# Patient Record
Sex: Female | Born: 1995 | Race: White | Hispanic: No | Marital: Married
Health system: Midwestern US, Community
[De-identification: ages and names within clinical notes are randomized; demographics above are authoritative.]

## PROBLEM LIST (undated history)

## (undated) DIAGNOSIS — N926 Irregular menstruation, unspecified: Secondary | ICD-10-CM

## (undated) DIAGNOSIS — F509 Eating disorder, unspecified: Secondary | ICD-10-CM

## (undated) DIAGNOSIS — F909 Attention-deficit hyperactivity disorder, unspecified type: Secondary | ICD-10-CM

## (undated) DIAGNOSIS — F329 Major depressive disorder, single episode, unspecified: Secondary | ICD-10-CM

## (undated) DIAGNOSIS — R7303 Prediabetes: Secondary | ICD-10-CM

## (undated) DIAGNOSIS — F32A Depression, unspecified: Secondary | ICD-10-CM

## (undated) HISTORY — PX: OTHER SURGICAL HISTORY: SHX169

## (undated) HISTORY — DX: Prediabetes: R73.03

## (undated) HISTORY — DX: Attention-deficit hyperactivity disorder, unspecified type: F90.9

## (undated) HISTORY — DX: Eating disorder, unspecified: F50.9

## (undated) HISTORY — DX: Depression, unspecified: F32.A

---
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
## 2016-09-19 ENCOUNTER — Ambulatory Visit (INDEPENDENT_AMBULATORY_CARE_PROVIDER_SITE_OTHER): Payer: TRICARE For Life (TFL) | Admitting: Advanced Practice Midwife

## 2016-09-19 ENCOUNTER — Encounter: Payer: Self-pay | Admitting: Advanced Practice Midwife

## 2016-09-19 VITALS — BP 138/72 | HR 92 | Ht 68.0 in | Wt 288.0 lb

## 2016-09-19 DIAGNOSIS — Z30011 Encounter for initial prescription of contraceptive pills: Secondary | ICD-10-CM

## 2016-09-19 DIAGNOSIS — Z113 Encounter for screening for infections with a predominantly sexual mode of transmission: Secondary | ICD-10-CM

## 2016-09-19 DIAGNOSIS — Z01419 Encounter for gynecological examination (general) (routine) without abnormal findings: Secondary | ICD-10-CM

## 2016-09-19 MED ORDER — NORGESTIMATE-ETH ESTRADIOL 0.25-35 MG-MCG PO TABS: 1.0000 | ORAL_TABLET | Freq: Every day | ORAL | 11 refills | Status: DC

## 2016-09-21 LAB — GC/CHLAMYDIA PROBE AMP
Chlamydia trachomatis, NAA: NEGATIVE
Neisseria gonorrhoeae by PCR: NEGATIVE

## 2016-09-21 NOTE — Progress Notes (Signed)
Patient ID: Faith Morris, female   DOB: Jan 14, 1996, 21 y.o.   MRN: [id number]     Gynecology Annual Exam  PCP: Patient, No Pcp Per  Chief Complaint:  Chief Complaint  Patient presents with  . Gynecologic Exam    History of Present Illness: Patient is a 21 y.o. G0P0000 presents for annual exam. The patient has complaints today of heavy, painful periods with clots. She is interested in hormonal regulation of her cycles. She takes metformin due to her pre-diabetic condition per her PCP.  LMP: Patient's last menstrual period was 08/29/2016 (approximate). Menarche:not applicable Average Interval: regular, 28 days Duration of flow: 4 days Heavy Menses: yes Clots: yes Intermenstrual Bleeding: no Postcoital Bleeding: no Dysmenorrhea: yes  The patient is sexually active. She currently uses condoms for contraception. She denies dyspareunia.  The patient does perform self breast exams.  There is no notable family history of breast or ovarian cancer in her family.  The patient wears seatbelts: yes.  The patient has regular exercise: yes.  She admits to improving healthy diet.  The patient denies current symptoms of depression.    Review of Systems: Review of Systems  Constitutional: Negative.   HENT: Negative.   Eyes: Negative.   Respiratory: Negative.   Cardiovascular: Negative.   Gastrointestinal: Negative.   Genitourinary: Negative.   Musculoskeletal: Negative.   Skin: Negative.   Neurological: Negative.   Endo/Heme/Allergies: Negative.   Psychiatric/Behavioral: Negative.     Past Medical History:  Past Medical History:  Diagnosis Date  . ADHD   . Prediabetes     Past Surgical History:  Past Surgical History:  Procedure Laterality Date  . wisdom teeth      Gynecologic History:  Patient's last menstrual period was 08/29/2016 (approximate). Contraception: condoms Last Pap: Has never had a PAP  Obstetric History: G0P0000  Family History:  Family History    Problem Relation Age of Onset  . Breast cancer Maternal Aunt 63    Social History:  Social History   Social History  . Marital status: Single    Spouse name: N/A  . Number of children: N/A  . Years of education: N/A   Occupational History  . Not on file.   Social History Main Topics  . Smoking status: Never Smoker  . Smokeless tobacco: Never Used  . Alcohol use No  . Drug use: No  . Sexual activity: Yes    Birth control/ protection: Condom, None   Other Topics Concern  . Not on file   Social History Narrative  . No narrative on file    Allergies:  Allergies  Allergen Reactions  . Cephalexin Itching, Swelling and Rash    Medications: Prior to Admission medications   Medication Sig Start Date End Date Taking? Authorizing Provider  metFORMIN (GLUCOPHAGE) 500 MG tablet Take by mouth 2 (two) times daily with a meal.   Yes [provider]  norgestimate-ethinyl estradiol (ORTHO-CYCLEN,SPRINTEC,PREVIFEM) 0.25-35 MG-MCG tablet Take 1 tablet by mouth daily. 09/19/16   Tresea Mall, CNM    Physical Exam Vitals: Blood pressure 138/72, pulse 92, height 5\' 8"  (1.727 m), weight 288 lb (130.6 kg), last menstrual period 08/29/2016.  General: NAD HEENT: normocephalic, anicteric Thyroid: no enlargement, no palpable nodules Pulmonary: No increased work of breathing, CTAB Cardiovascular: RRR, distal pulses 2+ Breast: Breast symmetrical, no tenderness, no palpable nodules or masses, no skin or nipple retraction present, no nipple discharge.  No axillary or supraclavicular lymphadenopathy. Abdomen: NABS, soft, non-tender, non-distended.  Umbilicus without  lesions.  No hepatomegaly, splenomegaly or masses palpable. No evidence of hernia  Genitourinary:  External: Normal external female genitalia.  Normal urethral meatus, normal  Bartholin's and Skene's glands.    Vagina: Normal vaginal mucosa, no evidence of prolapse.    Cervix: Grossly normal in appearance, no bleeding,  no CMT  Uterus: Non-enlarged, mobile, normal contour.    Adnexa: ovaries non-enlarged, no adnexal masses  Rectal: deferred  Lymphatic: no evidence of inguinal lymphadenopathy Extremities: no edema, erythema, or tenderness Neurologic: Grossly intact Psychiatric: mood appropriate, affect full   Assessment: 21 y.o. G0P0000 routine annual exam  Plan: Problem List Items Addressed This Visit    None    Visit Diagnoses    Well woman exam with routine gynecological exam    -  Primary   Relevant Orders   GC/Chlamydia Probe Amp (Completed)   Screen for sexually transmitted diseases       Relevant Orders   GC/Chlamydia Probe Amp (Completed)   Encounter for initial prescription of contraceptive pills       Relevant Medications   norgestimate-ethinyl estradiol (ORTHO-CYCLEN,SPRINTEC,PREVIFEM) 0.25-35 MG-MCG tablet      1) 4) Gardasil Series discussed and if applicable offered to patient - Patient is uncertain if she has previously completed 3 shot series   2) STI screening was offered and accepted   3) Increase healthy lifestyle diet   4) ASCCP guidelines and rational discussed.  Patient opts for beginning at age 13 screening interval  5) Contraception - Education given regarding options for contraception, including LARCs, pills, ring, patch, injection. The patient would like to try OCP for both contraception and cycle regulation  6) Follow up 1 year for routine annual exam  Tresea Mall, CNM

## 2016-10-24 DIAGNOSIS — G4762 Sleep related leg cramps: Secondary | ICD-10-CM | POA: Insufficient documentation

## 2016-10-24 DIAGNOSIS — R7303 Prediabetes: Secondary | ICD-10-CM | POA: Insufficient documentation

## 2017-05-22 ENCOUNTER — Ambulatory Visit: Payer: Self-pay | Admitting: Primary Care

## 2017-06-06 ENCOUNTER — Ambulatory Visit: Payer: Self-pay | Admitting: Primary Care

## 2017-06-06 ENCOUNTER — Encounter

## 2018-03-13 ENCOUNTER — Other Ambulatory Visit (HOSPITAL_COMMUNITY)
Admission: RE | Admit: 2018-03-13 | Discharge: 2018-03-13 | Disposition: A | Discharge status: Home / Self Care | Payer: Self-pay | Source: Ambulatory Visit | Attending: Obstetrics & Gynecology | Admitting: Obstetrics & Gynecology

## 2018-03-13 ENCOUNTER — Encounter: Payer: Self-pay | Admitting: Obstetrics & Gynecology

## 2018-03-13 ENCOUNTER — Ambulatory Visit (INDEPENDENT_AMBULATORY_CARE_PROVIDER_SITE_OTHER): Admitting: Obstetrics & Gynecology

## 2018-03-13 VITALS — BP 130/80 | HR 105 | Ht 68.0 in | Wt 284.0 lb

## 2018-03-13 DIAGNOSIS — Z124 Encounter for screening for malignant neoplasm of cervix: Secondary | ICD-10-CM

## 2018-03-13 DIAGNOSIS — Z113 Encounter for screening for infections with a predominantly sexual mode of transmission: Secondary | ICD-10-CM | POA: Insufficient documentation

## 2018-03-13 DIAGNOSIS — N921 Excessive and frequent menstruation with irregular cycle: Secondary | ICD-10-CM | POA: Diagnosis not present

## 2018-03-13 MED ORDER — NORETHIN ACE-ETH ESTRAD-FE 1-20 MG-MCG(24) PO TABS: 1.0000 | ORAL_TABLET | Freq: Every day | ORAL | 3 refills | Status: DC

## 2018-03-13 NOTE — Patient Instructions (Addendum)
Ethinyl Estradiol; Norethindrone Acetate; Ferrous fumarate tablets or capsules  What is this medicine?  ETHINYL ESTRADIOL; NORETHINDRONE ACETATE; FERROUS FUMARATE (ETH in il es tra DYE ole; nor eth IN drone AS e tate; FER us FUE ma rate) is an oral contraceptive. The products combine two types of female hormones, an estrogen and a progestin. They are used to prevent ovulation and pregnancy. Some products are also used to treat acne in females.  This medicine may be used for other purposes; ask your health care provider or pharmacist if you have questions.  COMMON BRAND NAME(S): Aurovela 24 Fe 1/20, Aurovela Fe, Blisovi 24 Fe, Blisovi Fe, Estrostep Fe, Gildess 24 Fe, Gildess Fe 1.5/30, Gildess Fe 1/20, Hailey 24 Fe, Junel Fe 1.5/30, Junel Fe 1/20, Junel Fe 24, Larin Fe, Lo Loestrin Fe, Loestrin 24 Fe, Loestrin FE 1.5/30, Loestrin FE 1/20, Lomedia 24 Fe, Microgestin 24 Fe, Microgestin Fe 1.5/30, Microgestin Fe 1/20, Tarina 24 Fe, Tarina Fe 1/20, Taytulla, Tilia Fe, Tri-Legest Fe  What should I tell my health care provider before I take this medicine?  They need to know if you have any of these conditions:  -abnormal vaginal bleeding  -blood vessel disease  -breast, cervical, endometrial, ovarian, liver, or uterine cancer  -diabetes  -gallbladder disease  -heart disease or recent heart attack  -high blood pressure  -high cholesterol  -history of blood clots  -kidney disease  -liver disease  -migraine headaches  -smoke tobacco  -stroke  -systemic lupus erythematosus (SLE)  -an unusual or allergic reaction to estrogens, progestins, other medicines, foods, dyes, or preservatives  -pregnant or trying to get pregnant  -breast-feeding  How should I use this medicine?  Take this medicine by mouth. To reduce nausea, this medicine may be taken with food. Follow the directions on the prescription label. Take this medicine at the same time each day and in the order directed on the package. Do not take your medicine more often  than directed.  A patient package insert for the product will be given with each prescription and refill. Read this sheet carefully each time. The sheet may change frequently.  Contact your pediatrician regarding the use of this medicine in children. Special care may be needed. This medicine has been used in female children who have started having menstrual periods.  Overdosage: If you think you have taken too much of this medicine contact a poison control center or emergency room at once.  NOTE: This medicine is only for you. Do not share this medicine with others.  What if I miss a dose?  If you miss a dose, refer to the patient information sheet you received with your medicine for direction. If you miss more than one pill, this medicine may not be as effective and you may need to use another form of birth control.  What may interact with this medicine?  Do not take this medicine with the following medication:  -dasabuvir; ombitasvir; paritaprevir; ritonavir  -ombitasvir; paritaprevir; ritonavir  This medicine may also interact with the following medications:  -acetaminophen  -antibiotics or medicines for infections, especially rifampin, rifabutin, rifapentine, and griseofulvin, and possibly penicillins or tetracyclines  -aprepitant  -ascorbic acid (vitamin C)  -atorvastatin  -barbiturate medicines, such as phenobarbital  -bosentan  -carbamazepine  -caffeine  -clofibrate  -cyclosporine  -dantrolene  -doxercalciferol  -felbamate  -grapefruit juice  -hydrocortisone  -medicines for anxiety or sleeping problems, such as diazepam or temazepam  -medicines for diabetes, including pioglitazone  -mineral oil  -modafinil  -mycophenolate  -  nefazodone  -oxcarbazepine  -phenytoin  -prednisolone  -ritonavir or other medicines for HIV infection or AIDS  -rosuvastatin  -selegiline  -soy isoflavones supplements  -St. John's wort  -tamoxifen or raloxifene  -theophylline  -thyroid hormones  -topiramate  -warfarin  This list may not  describe all possible interactions. Give your health care provider a list of all the medicines, herbs, non-prescription drugs, or dietary supplements you use. Also tell them if you smoke, drink alcohol, or use illegal drugs. Some items may interact with your medicine.  What should I watch for while using this medicine?  Visit your doctor or health care professional for regular checks on your progress. You will need a regular breast and pelvic exam and Pap smear while on this medicine.  Use an additional method of contraception during the first cycle that you take these tablets.  If you have any reason to think you are pregnant, stop taking this medicine right away and contact your doctor or health care professional.  If you are taking this medicine for hormone related problems, it may take several cycles of use to see improvement in your condition.  Smoking increases the risk of getting a blood clot or having a stroke while you are taking birth control pills, especially if you are more than 22 years old. You are strongly advised not to smoke.  This medicine can make your body retain fluid, making your fingers, hands, or ankles swell. Your blood pressure can go up. Contact your doctor or health care professional if you feel you are retaining fluid.  This medicine can make you more sensitive to the sun. Keep out of the sun. If you cannot avoid being in the sun, wear protective clothing and use sunscreen. Do not use sun lamps or tanning beds/booths.  If you wear contact lenses and notice visual changes, or if the lenses begin to feel uncomfortable, consult your eye care specialist.  In some women, tenderness, swelling, or minor bleeding of the gums may occur. Notify your dentist if this happens. Brushing and flossing your teeth regularly may help limit this. See your dentist regularly and inform your dentist of the medicines you are taking.  If you are going to have elective surgery, you may need to stop taking this  medicine before the surgery. Consult your health care professional for advice.  This medicine does not protect you against HIV infection (AIDS) or any other sexually transmitted diseases.  What side effects may I notice from receiving this medicine?  Side effects that you should report to your doctor or health care professional as soon as possible:  -allergic reactions like skin rash, itching or hives, swelling of the face, lips, or tongue  -breast tissue changes or discharge  -changes in vaginal bleeding during your period or between your periods  -changes in vision  -chest pain  -confusion  -coughing up blood  -dizziness  -feeling faint or lightheaded  -headaches or migraines  -leg, arm or groin pain  -loss of balance or coordination  -severe or sudden headaches  -stomach pain (severe)  -sudden shortness of breath  -sudden numbness or weakness of the face, arm or leg  -symptoms of vaginal infection like itching, irritation or unusual discharge  -tenderness in the upper abdomen  -trouble speaking or understanding  -vomiting  -yellowing of the eyes or skin  Side effects that usually do not require medical attention (report to your doctor or health care professional if they continue or are bothersome):  -breakthrough bleeding   and spotting that continues beyond the 3 initial cycles of pills  -breast tenderness  -mood changes, anxiety, depression, frustration, anger, or emotional outbursts  -increased sensitivity to sun or ultraviolet light  -nausea  -skin rash, acne, or brown spots on the skin  -weight gain (slight)  This list may not describe all possible side effects. Call your doctor for medical advice about side effects. You may report side effects to FDA at 1-800-FDA-1088.  Where should I keep my medicine?  Keep out of the reach of children.  Store at room temperature between 15 and 30 degrees C (59 and 86 degrees F). Throw away any unused medicine after the expiration date.  NOTE: This sheet is a summary. It may  not cover all possible information. If you have questions about this medicine, talk to your doctor, pharmacist, or health care provider.  © 2019 Elsevier/Gold Standard (2015-10-03 08:04:41)

## 2018-03-13 NOTE — Progress Notes (Signed)
Dysfunctional Uterine Bleeding Patient complains of irregular menses. She had been bleeding irregularly. She is now bleeding every 21-28 days and menses are lasting several days. She changes her pad or tampon every 1 hours. Clots are 4-6 cm in size. Dysmenorrhea:moderate, occurring throughout menses. Cyclic symptoms include: none. Current contraception: none. History of infertility: no. History of abnormal Pap smear: no.  She has h/o OCP use w reg cycles and 2 day periods.  She stopped 6 mos ago due to stress and forgetfulness.  She is not against their use again.  She has been on Metformin in past for pre-diabetes, but is off now and on diet control.  She is obese and has recently lost 17 lbs as she tries to imprve that.   PMHx: She  has a past medical history of ADHD and Prediabetes. Also,  has a past surgical history that includes wisdom teeth., family history includes Breast cancer (age of onset: 100) in her maternal aunt.,  reports that she has never smoked. She has never used smokeless tobacco. She reports that she does not drink alcohol or use drugs.  She has a current medication list which includes the following prescription(s): metformin, trazodone, vyvanse, and norethindrone acetate-ethinyl estrad-fe. Also, is allergic to cephalexin.  Review of Systems  Constitutional: Negative for chills, fever and malaise/fatigue.  HENT: Negative for congestion, sinus pain and sore throat.   Eyes: Negative for blurred vision and pain.  Respiratory: Negative for cough and wheezing.   Cardiovascular: Negative for chest pain and leg swelling.  Gastrointestinal: Negative for abdominal pain, constipation, diarrhea, heartburn, nausea and vomiting.  Genitourinary: Negative for dysuria, frequency, hematuria and urgency.  Musculoskeletal: Negative for back pain, joint pain, myalgias and neck pain.  Skin: Negative for itching and rash.  Neurological: Negative for dizziness, tremors and weakness.    Endo/Heme/Allergies: Does not bruise/bleed easily.  Psychiatric/Behavioral: Negative for depression. The patient is nervous/anxious. The patient does not have insomnia.     Objective: BP 130/80   Pulse (!) 105   Ht 5\' 8"  (1.727 m)   Wt 284 lb (128.8 kg)   BMI 43.18 kg/m  Physical Exam Constitutional:      General: She is not in acute distress.    Appearance: She is well-developed.  Genitourinary:     Pelvic exam was performed with patient supine.     Vagina, uterus and rectum normal.     No lesions in the vagina.     No vaginal bleeding.     No cervical motion tenderness, friability, lesion or polyp.     Uterus is mobile.     Uterus is not enlarged.     No uterine mass detected.    Uterus is midaxial.     No right or left adnexal mass present.     Right adnexa not tender.     Left adnexa not tender.  HENT:     Head: Normocephalic and atraumatic. No laceration.     Right Ear: Hearing normal.     Left Ear: Hearing normal.     Mouth/Throat:     Pharynx: Uvula midline.  Eyes:     Pupils: Pupils are equal, round, and reactive to light.  Neck:     Musculoskeletal: Normal range of motion and neck supple.     Thyroid: No thyromegaly.  Cardiovascular:     Rate and Rhythm: Normal rate and regular rhythm.     Heart sounds: No murmur. No friction rub. No gallop.   Pulmonary:  Effort: Pulmonary effort is normal. No respiratory distress.     Breath sounds: Normal breath sounds. No wheezing.  Chest:     Breasts:        Right: No mass, skin change or tenderness.        Left: No mass, skin change or tenderness.  Abdominal:     General: Bowel sounds are normal. There is no distension.     Palpations: Abdomen is soft.     Tenderness: There is no abdominal tenderness. There is no rebound.  Musculoskeletal: Normal range of motion.  Neurological:     Mental Status: She is alert and oriented to person, place, and time.     Cranial Nerves: No cranial nerve deficit.  Skin:     General: Skin is warm and dry.  Psychiatric:        Judgment: Judgment normal.  Vitals signs reviewed.   ASSESSMENT/PLAN:     Menometrorrhagia    -  Primary    Exam normal. Pt reassured. Likely hormonal etiology.    As she also prefers to go back on birth control, then the OCP option is best.    OCPs The risks /benefits of OCPs have been explained to the patient in detail.  Product literature has been given to her.  I have instructed her in the use of OCPs and have given her literature reinforcing this information.  I have explained to the patient that OCPs are not as effective for birth control during the first month of use, and that another form of contraception should be used during this time.  Both first-day start and Sunday start have been explained.  The risks and benefits of each was discussed.  She has been made aware of  the fact that other medications may affect the efficacy of OCPs.  I have answered all of her questions, and I believe that she has an understanding of the effectiveness and use of OCPs.   Samples/Rx LoLoestrin gv.   Info also gv.    Screen for STD (sexually transmitted disease)       Relevant Orders   Cytology - PAP/GC   Screening for cervical cancer       Relevant Orders   Cytology - PAP    Return yearly or PRN  Annamarie Major, MD, [facility], [facility] 03/13/2018  2:16 PM

## 2018-03-18 ENCOUNTER — Telehealth: Payer: Self-pay | Admitting: Obstetrics & Gynecology

## 2018-03-18 ENCOUNTER — Other Ambulatory Visit: Payer: Self-pay | Admitting: Obstetrics & Gynecology

## 2018-03-18 ENCOUNTER — Encounter: Payer: Self-pay | Admitting: Obstetrics & Gynecology

## 2018-03-18 LAB — CYTOLOGY - PAP
Chlamydia: POSITIVE — AB
Diagnosis: NEGATIVE
Neisseria Gonorrhea: NEGATIVE
TRICH (WINDOWPATH): NEGATIVE

## 2018-03-18 MED ORDER — AZITHROMYCIN 500 MG PO TABS: 1000.0000 mg | ORAL_TABLET | Freq: Once | ORAL | 1 refills | Status: AC

## 2018-03-18 NOTE — Telephone Encounter (Signed)
-----   Message from Nadara Mustard, MD sent at 03/18/2018 10:51 AM EST ----- Regarding: appt Plz cancel 03/25/2018 appt w Higinio Roger and schedule pt for follow up gyn visit in early March w [facility]

## 2018-03-18 NOTE — Telephone Encounter (Signed)
Called and left voice mail for patient to call back to be schedule °

## 2018-03-18 NOTE — Progress Notes (Signed)
PAP/Labs d/w pt. PAP normal STD screen- Pos Chlamydia    Pt reports no sx's    No prior history    Pt reports 2 recent partners (BF, one other one time) eRx for Azithromycin Discussed partner tx as well F/u one month for re-test  Annamarie Major, MD, [facility], [facility] 03/18/2018  10:55 AM

## 2018-03-19 ENCOUNTER — Encounter: Payer: Self-pay | Admitting: Obstetrics & Gynecology

## 2018-03-20 ENCOUNTER — Encounter: Payer: Self-pay | Admitting: Obstetrics & Gynecology

## 2018-03-23 ENCOUNTER — Encounter: Payer: Self-pay | Admitting: Obstetrics & Gynecology

## 2018-03-24 ENCOUNTER — Encounter: Payer: Self-pay | Admitting: Obstetrics & Gynecology

## 2018-03-25 ENCOUNTER — Ambulatory Visit: Admitting: Advanced Practice Midwife

## 2018-04-09 ENCOUNTER — Other Ambulatory Visit (HOSPITAL_COMMUNITY)
Admission: RE | Admit: 2018-04-09 | Discharge: 2018-04-09 | Disposition: A | Discharge status: Home / Self Care | Payer: Self-pay | Source: Ambulatory Visit | Attending: Obstetrics & Gynecology | Admitting: Obstetrics & Gynecology

## 2018-04-09 ENCOUNTER — Encounter: Payer: Self-pay | Admitting: Obstetrics & Gynecology

## 2018-04-09 ENCOUNTER — Ambulatory Visit (INDEPENDENT_AMBULATORY_CARE_PROVIDER_SITE_OTHER): Admitting: Obstetrics & Gynecology

## 2018-04-09 VITALS — BP 120/80 | Ht 68.0 in | Wt 278.0 lb

## 2018-04-09 DIAGNOSIS — A562 Chlamydial infection of genitourinary tract, unspecified: Secondary | ICD-10-CM | POA: Diagnosis not present

## 2018-04-09 NOTE — Patient Instructions (Signed)
Primary Care in the area  This is not meant to be comprehensive list, but a resource for some providers that you can call for counseling or medication needs. If you have a recommendation, please let us know.   [facility]:  [phone]               Dr Richard Gilbert               Dr Don Fisher               Dr Angela Bacigalupo  [facility]:  [phone] Dr Melinda Lada      Dr Krichna Sowles       

## 2018-04-09 NOTE — Progress Notes (Signed)
  History of Present Illness:  Faith Morris is a 23 y.o. who was treated for Chlamydia infection as found from PAP  approximately 4 weeks ago. Since that time, she states that her symptoms are improving. No pain or discharge.  Neg blood testing for other STDs done at [facility].   Also expresses concern over endometriosis, as she has FH and has pain w intercourse at times and dysmenorrhea.  Recently started OCPs for contraception and period control (first month finished, periods already somewhat improved).  PMHx: She  has a past medical history of ADHD and Prediabetes. Also,  has a past surgical history that includes wisdom teeth., family history includes Breast cancer (age of onset: 19) in her maternal aunt.,  reports that she has never smoked. She has never used smokeless tobacco. She reports that she does not drink alcohol or use drugs. Current Meds  Medication Sig  . Norethindrone Acetate-Ethinyl Estrad-FE (LOESTRIN 24 FE) 1-20 MG-MCG(24) tablet Take 1 tablet by mouth daily.  Marland Kitchen VYVANSE 70 MG capsule TK 1 C PO QAM  . Also, is allergic to cephalexin..  ROS  Physical Exam:  BP 120/80   Ht 5\' 8"  (1.727 m)   Wt 278 lb (126.1 kg)   BMI 42.27 kg/m  Body mass index is 42.27 kg/m. Constitutional: Well nourished, well developed female in no acute distress.  Abdomen: diffusely non tender to palpation, non distended, and no masses, hernias Neuro: Grossly intact Psych:  Normal mood and affect.    Assessment:   Chlamydial infection of genitourinary tract    -  Primary   Relevant Orders   Cervicovaginal ancillary only    Test for cure today Await to be sexually active until after results  Does not warrant Dx Lap for endometriosis eval at this time but is counseled this is next step if pain worsens or w future infertility.  Give OCPs more time to see if helps w sx's.  Needs PCP, list given  She was amenable to this plan and we will see her back for annual/PRN.  A total of 15 minutes were  spent face-to-face with the patient during this encounter and over half of that time dealt with counseling and coordination of care.  Annamarie Major, MD, [facility], [facility] 04/09/2018  11:37 AM

## 2018-04-11 ENCOUNTER — Telehealth: Payer: Self-pay | Admitting: Obstetrics & Gynecology

## 2018-04-11 LAB — CERVICOVAGINAL ANCILLARY ONLY
CHLAMYDIA, DNA PROBE: NEGATIVE
NEISSERIA GONORRHEA: NEGATIVE
Trichomonas: NEGATIVE

## 2018-04-11 NOTE — Telephone Encounter (Signed)
I had left message that all labs (infection cultures) negative.  Let her know.

## 2018-04-11 NOTE — Telephone Encounter (Signed)
Patient is returning missed call from Dr. Harris. Please advise  °

## 2018-04-11 NOTE — Telephone Encounter (Signed)
Pt aware.

## 2018-04-15 ENCOUNTER — Encounter: Payer: Self-pay | Admitting: Obstetrics & Gynecology

## 2018-04-24 ENCOUNTER — Encounter: Payer: Self-pay | Admitting: Obstetrics & Gynecology

## 2018-05-02 ENCOUNTER — Encounter: Payer: Self-pay | Admitting: Obstetrics & Gynecology

## 2018-05-05 ENCOUNTER — Other Ambulatory Visit: Payer: Self-pay

## 2018-05-05 MED ORDER — NORETHIN ACE-ETH ESTRAD-FE 1-20 MG-MCG(24) PO TABS: 1.0000 | ORAL_TABLET | Freq: Every day | ORAL | 3 refills | Status: DC

## 2018-07-26 ENCOUNTER — Encounter: Payer: Self-pay | Admitting: Obstetrics & Gynecology

## 2018-07-28 ENCOUNTER — Other Ambulatory Visit: Payer: Self-pay

## 2018-07-28 MED ORDER — NORETHIN ACE-ETH ESTRAD-FE 1-20 MG-MCG(24) PO TABS: 1.0000 | ORAL_TABLET | Freq: Every day | ORAL | 3 refills | Status: DC

## 2018-07-29 ENCOUNTER — Other Ambulatory Visit: Payer: Self-pay

## 2018-07-29 ENCOUNTER — Encounter: Payer: Self-pay | Admitting: Emergency Medicine

## 2018-07-29 ENCOUNTER — Emergency Department
Admission: EM | Admit: 2018-07-29 | Discharge: 2018-07-29 | Disposition: A | Discharge status: Home / Self Care | Attending: Emergency Medicine | Admitting: Emergency Medicine

## 2018-07-29 ENCOUNTER — Emergency Department

## 2018-07-29 DIAGNOSIS — R519 Headache, unspecified: Secondary | ICD-10-CM

## 2018-07-29 DIAGNOSIS — Z7984 Long term (current) use of oral hypoglycemic drugs: Secondary | ICD-10-CM | POA: Diagnosis not present

## 2018-07-29 DIAGNOSIS — R42 Dizziness and giddiness: Secondary | ICD-10-CM | POA: Diagnosis not present

## 2018-07-29 DIAGNOSIS — F419 Anxiety disorder, unspecified: Secondary | ICD-10-CM | POA: Insufficient documentation

## 2018-07-29 DIAGNOSIS — Z79899 Other long term (current) drug therapy: Secondary | ICD-10-CM | POA: Insufficient documentation

## 2018-07-29 DIAGNOSIS — R630 Anorexia: Secondary | ICD-10-CM | POA: Insufficient documentation

## 2018-07-29 DIAGNOSIS — R51 Headache: Secondary | ICD-10-CM | POA: Insufficient documentation

## 2018-07-29 MED ORDER — DICLOFENAC SODIUM 25 MG PO TBEC: 25.0000 mg | DELAYED_RELEASE_TABLET | Freq: Two times a day (BID) | ORAL | 0 refills | Status: AC

## 2018-07-29 MED ORDER — LORAZEPAM 1 MG PO TABS
2.0000 mg | ORAL_TABLET | Freq: Once | ORAL | Status: AC
  Administered 2018-07-29: 2 mg via ORAL
  Filled 2018-07-29: qty 2

## 2018-07-29 MED ORDER — FEXOFENADINE-PSEUDOEPHED ER 60-120 MG PO TB12: 1.0000 | ORAL_TABLET | Freq: Two times a day (BID) | ORAL | 0 refills | Status: DC

## 2018-07-29 MED ORDER — ONDANSETRON 8 MG PO TBDP
8.0000 mg | ORAL_TABLET | Freq: Once | ORAL | Status: AC
  Administered 2018-07-29: 19:00:00 8 mg via ORAL
  Filled 2018-07-29: qty 1
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
## 2018-08-08 ENCOUNTER — Ambulatory Visit: Payer: Self-pay

## 2018-08-08 NOTE — Telephone Encounter (Signed)
Pt. Called back prior to nurse triage calling her, and was advised by an Agent, that due to the [facility] offices being closed, she would need to go to an UC, for evaluation of her symptoms.  Currently does not have a PCP.  Was advised she can call back on 7/6, and an appt. can be made, to establish care with a PCP.  Pt. verb. understanding and agreed with plan.

## 2018-08-09 ENCOUNTER — Ambulatory Visit (HOSPITAL_COMMUNITY)
Admission: EM | Admit: 2018-08-09 | Discharge: 2018-08-09 | Disposition: A | Discharge status: Home / Self Care | Attending: Family Medicine | Admitting: Family Medicine

## 2018-08-09 ENCOUNTER — Other Ambulatory Visit: Payer: Self-pay

## 2018-08-09 ENCOUNTER — Encounter (HOSPITAL_COMMUNITY): Payer: Self-pay

## 2018-08-09 DIAGNOSIS — J0110 Acute frontal sinusitis, unspecified: Secondary | ICD-10-CM | POA: Diagnosis not present

## 2018-08-09 MED ORDER — DOXYCYCLINE HYCLATE 100 MG PO CAPS: 100.0000 mg | ORAL_CAPSULE | Freq: Two times a day (BID) | ORAL | 0 refills | Status: DC

## 2018-08-09 NOTE — ED Triage Notes (Signed)
Pt presents with sinus symptoms; Pt has facial pressure, sinus headache, nasal drainage, and non productive cough from drainage X 7 days.

## 2018-08-09 NOTE — ED Provider Notes (Signed)
[facility]   [id number] 08/09/18 Arrival Time: 5397  ASSESSMENT & PLAN:  1. Acute non-recurrent frontal sinusitis     Meds ordered this encounter  Medications  . doxycycline (VIBRAMYCIN) 100 MG capsule    Sig: Take 1 capsule (100 mg total) by mouth 2 (two) times daily.    Dispense:  20 capsule    Refill:  0   Will follow up here if not seeing improvement over the next several days. Tylenol/Advil as needed. Ensure adequate fluid intake and rest. Continue Allegra.  Reviewed expectations re: course of current medical issues. Questions answered. Outlined signs and symptoms indicating need for more acute intervention. Patient verbalized understanding. After Visit Summary given.   SUBJECTIVE: History from: patient.  Kataleia Quaranta is a 23 y.o. female who presents with complaint of nasal congestion, post-nasal drainage, and sinus pain. Reports having head CT earlier this week and being told "my sinuses looked congested or blocked". Now with frontal sinus pressure/pain. Onset gradual, over the past week. Respiratory symptoms: none. Fever: absent. Overall normal PO intake without n/v. OTC treatment: Allegra "that makes my nose drain". Seasonal allergies: unsure. History of frequent sinus infections: no. No specific aggravating or alleviating factors reported. Social History   Tobacco Use  Smoking Status Never Smoker  Smokeless Tobacco Never Used    ROS: As per HPI.  OBJECTIVE:  Vitals:   08/09/18 1049  BP: 138/80  Pulse: (!) 113  Resp: 20  Temp: 98.7 F (37.1 C)  TempSrc: Oral  SpO2: 100%     General appearance: alert; no distress HEENT: nasal congestion; clear runny nose; throat irritation secondary to post-nasal drainage; bilateral frontal tenderness to palpation; turbinates boggy Neck: supple without LAD; trachea midline CV: slight tachycardia; regular Lungs: unlabored respirations, symmetrical air entry; cough: absent; no respiratory distress Skin:  warm and dry Psychological: alert and cooperative; normal mood and affect  Allergies  Allergen Reactions  . Cephalexin Itching, Swelling and Rash    Past Medical History:  Diagnosis Date  . ADHD   . Prediabetes    Family History  Problem Relation Age of Onset  . Breast cancer Maternal Aunt 60   Social History   Socioeconomic History  . Marital status: Single    Spouse name: Not on file  . Number of children: Not on file  . Years of education: Not on file  . Highest education level: Not on file  Occupational History  . Not on file  Social Needs  . Financial resource strain: Not on file  . Food insecurity    Worry: Not on file    Inability: Not on file  . Transportation needs    Medical: Not on file    Non-medical: Not on file  Tobacco Use  . Smoking status: Never Smoker  . Smokeless tobacco: Never Used  Substance and Sexual Activity  . Alcohol use: No  . Drug use: No  . Sexual activity: Yes    Birth control/protection: Condom, None  Lifestyle  . Physical activity    Days per week: Not on file    Minutes per session: Not on file  . Stress: Not on file  Relationships  . Social Herbalist on phone: Not on file    Gets together: Not on file    Attends religious service: Not on file    Active member of club or organization: Not on file    Attends meetings of clubs or organizations: Not on file  Relationship status: Not on file  . Intimate partner violence    Fear of current or ex partner: Not on file    Emotionally abused: Not on file    Physically abused: Not on file    Forced sexual activity: Not on file  Other Topics Concern  . Not on file  Social History Narrative  . Not on file            Mardella LaymanHagler, Quenton Recendez, MD 08/09/18 1120

## 2018-08-18 DIAGNOSIS — J310 Chronic rhinitis: Secondary | ICD-10-CM | POA: Insufficient documentation

## 2018-08-18 DIAGNOSIS — G8929 Other chronic pain: Secondary | ICD-10-CM | POA: Insufficient documentation

## 2018-10-16 ENCOUNTER — Encounter: Payer: Self-pay | Admitting: Obstetrics & Gynecology

## 2018-10-17 ENCOUNTER — Other Ambulatory Visit: Payer: Self-pay

## 2018-10-17 ENCOUNTER — Encounter: Payer: Self-pay | Admitting: Obstetrics & Gynecology

## 2018-10-17 ENCOUNTER — Ambulatory Visit (INDEPENDENT_AMBULATORY_CARE_PROVIDER_SITE_OTHER): Admitting: Obstetrics & Gynecology

## 2018-10-17 VITALS — BP 148/80 | Ht 68.0 in | Wt 263.0 lb

## 2018-10-17 DIAGNOSIS — I8390 Asymptomatic varicose veins of unspecified lower extremity: Secondary | ICD-10-CM | POA: Diagnosis not present

## 2018-10-17 DIAGNOSIS — N9089 Other specified noninflammatory disorders of vulva and perineum: Secondary | ICD-10-CM | POA: Diagnosis not present

## 2018-10-17 NOTE — Telephone Encounter (Signed)
Please advise 

## 2018-10-17 NOTE — Telephone Encounter (Signed)
[facility] appt 1140 or sometime today

## 2018-10-17 NOTE — Telephone Encounter (Signed)
Patient is schedule for 10/17/18 at 2:20 with [facility]

## 2018-10-17 NOTE — Progress Notes (Signed)
HPI:      Ms. Fayelynn Distel is a 23 y.o. G0P0000, Patient's last menstrual period was 09/02/2018., presents today for a problem visit.  She complains of:  Vulvar concern:   This is a 23 y.o. old Caucasian/White female who presents for the evaluation of vulvar lesion(s). She describes the vulvar lesion(s) as asymmetrical, elliptical, soft, discolored in a dark color like a bruise or vein. She indicates that she has noticed 1 lesions recently with this change in color, but has felt a cyst or bump here for 2 years (seen GYN in past, said it was benign).  No pain, d/c, erythema, scaliness, ulceration.  PMHx: She  has a past medical history of ADHD and Prediabetes. Also,  has a past surgical history that includes wisdom teeth., family history includes Breast cancer (age of onset: 55) in her maternal aunt.,  reports that she has never smoked. She has never used smokeless tobacco. She reports that she does not drink alcohol or use drugs.  She has a current medication list which includes the following prescription(s): vyvanse, doxycycline, fexofenadine-pseudoephedrine, metformin, norethindrone acetate-ethinyl estrad-fe, and trazodone. Also, is allergic to cephalexin.  Review of Systems  Constitutional: Negative for chills, fever and malaise/fatigue.  HENT: Negative for congestion, sinus pain and sore throat.   Eyes: Negative for blurred vision and pain.  Respiratory: Negative for cough and wheezing.   Cardiovascular: Negative for chest pain and leg swelling.  Gastrointestinal: Negative for abdominal pain, constipation, diarrhea, heartburn, nausea and vomiting.  Genitourinary: Negative for dysuria, frequency, hematuria and urgency.  Musculoskeletal: Negative for back pain, joint pain, myalgias and neck pain.  Skin: Negative for itching and rash.  Neurological: Negative for dizziness, tremors and weakness.  Endo/Heme/Allergies: Does not bruise/bleed easily.  Psychiatric/Behavioral: Negative for  depression. The patient is not nervous/anxious and does not have insomnia.     Objective: BP (!) 148/80   Ht 5\' 8"  (1.727 m)   Wt 263 lb (119.3 kg)   LMP 09/02/2018   BMI 39.99 kg/m  Physical Exam Constitutional:      General: She is not in acute distress.    Appearance: She is well-developed.  Genitourinary:     Pelvic exam was performed with patient supine.     Vagina and uterus normal.        No vaginal erythema or bleeding.     No cervical motion tenderness, discharge, polyp or nabothian cyst.     Uterus is mobile.     Uterus is not enlarged.     No uterine mass detected.    Uterus is midaxial.     No right or left adnexal mass present.     Right adnexa not tender.     Left adnexa not tender.  HENT:     Head: Normocephalic and atraumatic.     Nose: Nose normal.  Abdominal:     General: There is no distension.     Palpations: Abdomen is soft.     Tenderness: There is no abdominal tenderness.  Musculoskeletal: Normal range of motion.  Neurological:     Mental Status: She is alert and oriented to person, place, and time.     Cranial Nerves: No cranial nerve deficit.  Skin:    General: Skin is warm and dry.  Psychiatric:        Attention and Perception: Attention normal.        Mood and Affect: Mood and affect normal.        Speech: Speech normal.  Behavior: Behavior normal.        Thought Content: Thought content normal.        Judgment: Judgment normal.     ASSESSMENT/PLAN:    Problem List Items Addressed This Visit    Asymptomatic varicose veins    -  Primary   Vulvar lesion        Monitor for now No s/sx infection or cancer No sx's from lesion  Annamarie MajorPaul Shellyann Wandrey, MD, Merlinda FrederickFACOG [facility], [facility] 10/17/2018  2:28 PM

## 2018-10-21 ENCOUNTER — Ambulatory Visit: Admitting: Obstetrics & Gynecology

## 2018-11-03 ENCOUNTER — Ambulatory Visit (INDEPENDENT_AMBULATORY_CARE_PROVIDER_SITE_OTHER): Admitting: Family Medicine

## 2018-11-03 ENCOUNTER — Other Ambulatory Visit: Payer: Self-pay

## 2018-11-03 ENCOUNTER — Encounter: Payer: Self-pay | Admitting: Family Medicine

## 2018-11-03 ENCOUNTER — Encounter: Payer: Self-pay | Admitting: Obstetrics & Gynecology

## 2018-11-03 VITALS — BP 142/96 | HR 110 | Temp 98.2°F | Ht 68.0 in | Wt 260.8 lb

## 2018-11-03 DIAGNOSIS — R030 Elevated blood-pressure reading, without diagnosis of hypertension: Secondary | ICD-10-CM

## 2018-11-03 DIAGNOSIS — E6609 Other obesity due to excess calories: Secondary | ICD-10-CM

## 2018-11-03 DIAGNOSIS — Z7689 Persons encountering health services in other specified circumstances: Secondary | ICD-10-CM | POA: Diagnosis not present

## 2018-11-03 DIAGNOSIS — F411 Generalized anxiety disorder: Secondary | ICD-10-CM | POA: Diagnosis not present

## 2018-11-03 DIAGNOSIS — M25551 Pain in right hip: Secondary | ICD-10-CM

## 2018-11-03 DIAGNOSIS — Z23 Encounter for immunization: Secondary | ICD-10-CM | POA: Diagnosis not present

## 2018-11-03 DIAGNOSIS — Z6839 Body mass index (BMI) 39.0-39.9, adult: Secondary | ICD-10-CM

## 2018-11-03 NOTE — Patient Instructions (Addendum)
Good to see you today  Schedule your labs for fasting when you can.   Follow up in 6 months  For your hip- stretch several times a week, look at [employer], Yoga with Vincente Liberty  Keep up the good work with your diet and exercise
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
## 2018-11-04 ENCOUNTER — Other Ambulatory Visit: Payer: Self-pay | Admitting: Obstetrics & Gynecology

## 2018-11-04 ENCOUNTER — Encounter: Payer: Self-pay | Admitting: Obstetrics & Gynecology

## 2018-11-04 ENCOUNTER — Other Ambulatory Visit: Payer: Self-pay

## 2018-11-04 MED ORDER — DROSPIRENONE-ETHINYL ESTRADIOL 3-0.02 MG PO TABS: 1.0000 | ORAL_TABLET | Freq: Every day | ORAL | 1 refills | Status: DC

## 2019-01-13 ENCOUNTER — Encounter: Payer: Self-pay | Admitting: Family Medicine

## 2019-01-13 ENCOUNTER — Encounter: Payer: Self-pay | Admitting: Obstetrics & Gynecology

## 2019-03-09 ENCOUNTER — Other Ambulatory Visit: Payer: Self-pay

## 2019-03-09 ENCOUNTER — Encounter: Payer: Self-pay | Admitting: Obstetrics & Gynecology

## 2019-03-09 ENCOUNTER — Ambulatory Visit (INDEPENDENT_AMBULATORY_CARE_PROVIDER_SITE_OTHER): Payer: PRIVATE HEALTH INSURANCE | Admitting: Obstetrics & Gynecology

## 2019-03-09 ENCOUNTER — Other Ambulatory Visit (HOSPITAL_COMMUNITY)
Admission: RE | Admit: 2019-03-09 | Discharge: 2019-03-09 | Disposition: A | Discharge status: Home / Self Care | Source: Ambulatory Visit | Attending: Obstetrics & Gynecology | Admitting: Obstetrics & Gynecology

## 2019-03-09 VITALS — BP 124/72 | Ht 68.0 in | Wt 270.0 lb

## 2019-03-09 DIAGNOSIS — Z124 Encounter for screening for malignant neoplasm of cervix: Secondary | ICD-10-CM

## 2019-03-09 DIAGNOSIS — E669 Obesity, unspecified: Secondary | ICD-10-CM

## 2019-03-09 DIAGNOSIS — Z113 Encounter for screening for infections with a predominantly sexual mode of transmission: Secondary | ICD-10-CM

## 2019-03-09 DIAGNOSIS — Z8619 Personal history of other infectious and parasitic diseases: Secondary | ICD-10-CM | POA: Insufficient documentation

## 2019-03-09 DIAGNOSIS — Z01419 Encounter for gynecological examination (general) (routine) without abnormal findings: Secondary | ICD-10-CM

## 2019-03-09 DIAGNOSIS — Z6841 Body Mass Index (BMI) 40.0 and over, adult: Secondary | ICD-10-CM | POA: Diagnosis not present

## 2019-03-09 DIAGNOSIS — Z3041 Encounter for surveillance of contraceptive pills: Secondary | ICD-10-CM

## 2019-03-09 MED ORDER — DROSPIRENONE-ETHINYL ESTRADIOL 3-0.02 MG PO TABS: 1.0000 | ORAL_TABLET | Freq: Every day | ORAL | 3 refills | Status: DC

## 2019-03-09 NOTE — Patient Instructions (Signed)
Drospirenone; Ethinyl Estradiol tablets What is this medicine? DROSPIRENONE; ETHINYL ESTRADIOL (dro SPY re nown; ETH in il es tra DYE ole) is an oral contraceptive (birth control pill). This medicine combines two types of female hormones, an estrogen and a progestin. It is used to prevent ovulation and pregnancy. This medicine may be used for other purposes; ask your health care provider or pharmacist if you have questions. COMMON BRAND NAME(S): Gianvi, Jasmiel, Lo-Zumandimine, Loryna, Nikki 28-Day, Ocella, Syeda, Vestura, Yasmin, Yaz, Zarah, Zumandimine What should I tell my health care provider before I take this medicine? They need to know if you have or ever had any of these conditions:  abnormal vaginal bleeding  adrenal gland disease  blood vessel disease or blood clots  breast, cervical, endometrial, ovarian, liver, or uterine cancer  diabetes  gallbladder disease  heart disease or recent heart attack  high blood pressure  high cholesterol  high potassium level  kidney disease  liver disease  migraine headaches  stroke  systemic lupus erythematosus (SLE)  tobacco smoker  an unusual or allergic reaction to estrogens, progestins, or other medicines, foods, dyes, or preservatives  pregnant or trying to get pregnant  breast-feeding How should I use this medicine? Take this medicine by mouth. To reduce nausea, this medicine may be taken with food. Follow the directions on the prescription label. Take this medicine at the same time each day and in the order directed on the package. Do not take your medicine more often than directed. A patient package insert for the product will be given with each prescription and refill. Read this sheet carefully each time. The sheet may change frequently. Talk to your pediatrician regarding the use of this medicine in children. Special care may be needed. This medicine has been used in female children who have started having  menstrual periods. Overdosage: If you think you have taken too much of this medicine contact a poison control center or emergency room at once. NOTE: This medicine is only for you. Do not share this medicine with others. What if I miss a dose? If you miss a dose, refer to the patient information sheet you received with your medicine for direction. If you miss more than one pill, this medicine may not be as effective and you may need to use another form of birth control. What may interact with this medicine? Do not take this medicine with any of the following medications:  aminoglutethimide  amprenavir, fosamprenavir  atazanavir; cobicistat  anastrozole  bosentan  exemestane  letrozole  metyrapone  testolactone This medicine may also interact with the following medications:  acetaminophen  antiviral medicines for HIV or AIDS  aprepitant  barbiturates  certain antibiotics like rifampin, rifabutin, rifapentine, and possibly penicillins or tetracyclines  certain diuretics like amiloride, spironolactone, triamterene  certain medicines for fungal infections like griseofulvin, ketoconazole, itraconazole  certain medications for high blood pressure or heart conditions like ACE-inhibitors, Angiotensin-II receptor blockers, eplerenone  certain medicines for seizures like carbamazepine, oxcarbazepine, phenobarbital, phenytoin  cholestyramine  cobicistat  corticosteroid like hydrocortisone and prednisolone  cyclosporine  dantrolene  felbamate  grapefruit juice  heparin  lamotrigine  medicines for diabetes, including pioglitazone  modafinil  NSAIDs  potassium supplements  pyrimethamine  raloxifene  St. John's wort  sulfasalazine  tamoxifen  topiramate  thyroid hormones  warfarin his list may not describe all possible interactions. Give your health care provider a list of all the medicines, herbs, non-prescription drugs, or dietary supplements  you use. Also tell them   if you smoke, drink alcohol, or use illegal drugs. Some items may interact with your medicine. This list may not describe all possible interactions. Give your health care provider a list of all the medicines, herbs, non-prescription drugs, or dietary supplements you use. Also tell them if you smoke, drink alcohol, or use illegal drugs. Some items may interact with your medicine. What should I watch for while using this medicine? Visit your doctor or health care professional for regular checks on your progress. You will need a regular breast and pelvic exam and Pap smear while on this medicine. Use an additional method of contraception during the first cycle that you take these tablets. If you have any reason to think you are pregnant, stop taking this medicine right away and contact your doctor or health care professional. If you are taking this medicine for hormone related problems, it may take several cycles of use to see improvement in your condition. Smoking increases the risk of getting a blood clot or having a stroke while you are taking birth control pills, especially if you are more than 23 years old. You are strongly advised not to smoke. This medicine can make your body retain fluid, making your fingers, hands, or ankles swell. Your blood pressure can go up. Contact your doctor or health care professional if you feel you are retaining fluid. This medicine can make you more sensitive to the sun. Keep out of the sun. If you cannot avoid being in the sun, wear protective clothing and use sunscreen. Do not use sun lamps or tanning beds/booths. If you wear contact lenses and notice visual changes, or if the lenses begin to feel uncomfortable, consult your eye care specialist. In some women, tenderness, swelling, or minor bleeding of the gums may occur. Notify your dentist if this happens. Brushing and flossing your teeth regularly may help limit this. See your dentist  regularly and inform your dentist of the medicines you are taking. If you are going to have elective surgery, you may need to stop taking this medicine before the surgery. Consult your health care professional for advice. This medicine does not protect you against HIV infection (AIDS) or any other sexually transmitted diseases. What side effects may I notice from receiving this medicine? Side effects that you should report to your doctor or health care professional as soon as possible:  allergic reactions like skin rash, itching or hives, swelling of the face, lips, or tongue  breast tissue changes or discharge  changes in vision  chest pain  confusion, trouble speaking or understanding  dark urine  general ill feeling or flu-like symptoms  light-colored stools  nausea, vomiting  pain, swelling, warmth in the leg  right upper belly pain  severe headaches  shortness of breath  sudden numbness or weakness of the face, arm or leg  trouble walking, dizziness, loss of balance or coordination  unusual vaginal bleeding  yellowing of the eyes or skin Side effects that usually do not require medical attention (report to your doctor or health care professional if they continue or are bothersome):  acne  brown spots on the face  change in appetite  change in sexual desire  depressed mood or mood swings  fluid retention and swelling  stomach cramps or bloating  unusually weak or tired  weight gain This list may not describe all possible side effects. Call your doctor for medical advice about side effects. You may report side effects to FDA at 1-800-FDA-1088. Where should I   keep my medicine? Keep out of the reach of children. Store at room temperature between 15 and 30 degrees C (59 and 86 degrees F). Throw away any unused medicine after the expiration date. NOTE: This sheet is a summary. It may not cover all possible information. If you have questions about this  medicine, talk to your doctor, pharmacist, or health care provider.  2020 Elsevier/Gold Standard (2015-10-14 13:52:56)  

## 2019-03-09 NOTE — Progress Notes (Signed)
HPI:      [name] is a 24 y.o. G0P0000 who LMP was Patient's last menstrual period was 03/28/2018 (approximate)., she presents today for her annual examination. The patient has 3 days h/o lower pelvic pains, but resolved today.  There was no radiation, associated sx's, or modifiers. The patient is sexually active. Her prior GYN screen revealed chlamydia and she was alos having pain then, treated w Doxycycline (early 2020). . The patient does perform self breast exams.  There is no notable family history of breast or ovarian cancer in her family.  The patient has regular exercise: yes.  The patient denies current symptoms of depression.    GYN History: Contraception: none and prior Yaz OCP but stopped when Rx ran out and she has changed insurance.  PMHx: Past Medical History:  Diagnosis Date  . ADHD   . Depression   . Eating disorder    takes Vyvanse for overeating.   . Prediabetes    Past Surgical History:  Procedure Laterality Date  . wisdom teeth     Family History  Problem Relation Age of Onset  . Breast cancer Maternal Aunt 60  . Miscarriages / [place] Mother   . Alcohol abuse Father   . Arthritis Father   . Learning disabilities Father   . Depression Sister   . Learning disabilities Sister    Social History   Tobacco Use  . Smoking status: Never Smoker  . Smokeless tobacco: Never Used  Substance Use Topics  . Alcohol use: No  . Drug use: No    Current Outpatient Medications:  .  citalopram (CELEXA) 20 MG tablet, Take 20 mg by mouth daily., Disp: , Rfl:  .  drospirenone-ethinyl estradiol (YAZ) 3-0.02 MG tablet, Take 1 tablet by mouth daily., Disp: 3 Package, Rfl: 3 .  meclizine (ANTIVERT) 25 MG tablet, TAKE 1 TABLET BY MOUTH THREE TIMES DAILY AS NEEDED FOR 10 DAYS, Disp: , Rfl:  .  traZODone (DESYREL) 50 MG tablet, TK 1 T PO HS, Disp: , Rfl:  .  VYVANSE 70 MG capsule, TK 1 C PO QAM, Disp: , Rfl:  Allergies: Cephalexin  Review of Systems    Constitutional: Negative for chills, fever and malaise/fatigue.  HENT: Negative for congestion, sinus pain and sore throat.   Eyes: Negative for blurred vision and pain.  Respiratory: Negative for cough and wheezing.   Cardiovascular: Negative for chest pain and leg swelling.  Gastrointestinal: Negative for abdominal pain, constipation, diarrhea, heartburn, nausea and vomiting.  Genitourinary: Negative for dysuria, frequency, hematuria and urgency.  Musculoskeletal: Negative for back pain, joint pain, myalgias and neck pain.  Skin: Negative for itching and rash.  Neurological: Negative for dizziness, tremors and weakness.  Endo/Heme/Allergies: Does not bruise/bleed easily.  Psychiatric/Behavioral: Positive for depression. The patient is nervous/anxious. The patient does not have insomnia.     Objective: BP 124/72   Ht 5\' 8"  (1.727 m)   Wt 270 lb (122.5 kg)   LMP 03/28/2018 (Approximate)   BMI 41.05 kg/m   Filed Weights   03/09/19 0941  Weight: 270 lb (122.5 kg)   Body mass index is 41.05 kg/m. Physical Exam Constitutional:      General: She is not in acute distress.    Appearance: She is well-developed. She is obese.  Genitourinary:     Pelvic exam was performed with patient supine.     Vagina, uterus and rectum normal.        No lesions in the vagina.  No vaginal bleeding.     No cervical motion tenderness, friability, lesion or polyp.     Uterus is mobile.     Uterus is not enlarged.     No uterine mass detected.    Uterus is midaxial.     No right or left adnexal mass present.     Right adnexa not tender.     Left adnexa not tender.     Genitourinary Comments: Small vulvar blemish, NT not elevated and slightly discolored, no apparent change from 6 mos ago  HENT:     Head: Normocephalic and atraumatic. No laceration.     Right Ear: Hearing normal.     Left Ear: Hearing normal.     Mouth/Throat:     Pharynx: Uvula midline.  Eyes:     Pupils: Pupils are  equal, round, and reactive to light.  Neck:     Thyroid: No thyromegaly.  Cardiovascular:     Rate and Rhythm: Normal rate and regular rhythm.     Heart sounds: No murmur. No friction rub. No gallop.   Pulmonary:     Effort: Pulmonary effort is normal. No respiratory distress.     Breath sounds: Normal breath sounds. No wheezing.  Chest:     Breasts:        Right: No mass, skin change or tenderness.        Left: No mass, skin change or tenderness.  Abdominal:     General: Bowel sounds are normal. There is no distension.     Palpations: Abdomen is soft.     Tenderness: There is no abdominal tenderness. There is no rebound.  Musculoskeletal:        General: Normal range of motion.     Cervical back: Normal range of motion and neck supple.  Neurological:     Mental Status: She is alert and oriented to person, place, and time.     Cranial Nerves: No cranial nerve deficit.  Skin:    General: Skin is warm and dry.  Psychiatric:        Judgment: Judgment normal.  Vitals reviewed.     Assessment:  ANNUAL EXAM 1. Screening for cervical cancer   2. Screen for STD (sexually transmitted disease)   3. History of chlamydia   4. Women's annual routine gynecological examination   5. Encounter for surveillance of contraceptive pills      Screening Plan:            1.  Cervical Screening-  Pap smear done today, DNA probe for chlamydia and GC obtained  2.  Counseling for contraception: oral contraceptives (estrogen/progesterone) - drospirenone-ethinyl estradiol (YAZ) 3-0.02 MG tablet; Take 1 tablet by mouth daily.  Dispense: 3 Package; Refill: 3 - OCPs The risks /benefits of OCPs have been explained to the patient in detail.  Product literature has been given to her.  I have instructed her in the use of OCPs and have given her literature reinforcing this information.  I have explained to the patient that OCPs are not as effective for birth control during the first month of use, and that  another form of contraception should be used during this time.  Both first-day start and Sunday start have been explained.  The risks and benefits of each was discussed.  She has been made aware of  the fact that other medications may affect the efficacy of OCPs.  I have answered all of her questions, and I believe that she has an  understanding of the effectiveness and use of OCPs.  Monitor for pain recurrence.  Not c/w endometriosis.  PID a possibility, yet normal exam today.  Consider [place] if pain becomes more persistent.     F/U  Return in about 1 year (around 03/08/2020) for Annual.  Annamarie Major, MD, [facility], [facility] 03/09/2019  10:29 AM

## 2019-03-10 ENCOUNTER — Other Ambulatory Visit: Payer: PRIVATE HEALTH INSURANCE

## 2019-03-10 ENCOUNTER — Other Ambulatory Visit: Payer: Self-pay

## 2019-03-10 ENCOUNTER — Telehealth: Payer: Self-pay

## 2019-03-10 ENCOUNTER — Other Ambulatory Visit: Payer: Self-pay | Admitting: Obstetrics & Gynecology

## 2019-03-10 DIAGNOSIS — Z113 Encounter for screening for infections with a predominantly sexual mode of transmission: Secondary | ICD-10-CM

## 2019-03-10 NOTE — Telephone Encounter (Signed)
Spoke w/[facility] who advised they can add HSV testing to PAP with an add on testing order.

## 2019-03-10 NOTE — Telephone Encounter (Signed)
Patient saw [facility] yesterday and had a pap. She is inquiring if any STD testing was done. She's is specifically inquiring about HSV as she just learned she could have been exposed. She hasn't had any symptoms or out breaks. Notified only Gonorrhea and Chlamydia were ordered with her pap smear. Patient is requesting for HSV to be added on. Advised will need to verify with lab if this can be done.

## 2019-03-10 NOTE — Telephone Encounter (Signed)
Add on testing form completed and faxed to [facility] per patient request to add on HSV to her PAP.

## 2019-03-10 NOTE — Telephone Encounter (Signed)
Notifed patient HSV testing can be added. However, I did advise her that HSV Cultures are typically taken from an outbreak site (which she has not had) and this specimen was taken from cervix/endocervix/vaginal and would likely be negative. Informed her that she can have blood test to see if she has ever been exposed or has the HSV virus.  She would like that done today. Apt scheduled for 2:20 today for HSV Panel.

## 2019-03-11 ENCOUNTER — Encounter: Payer: Self-pay | Admitting: Family Medicine

## 2019-03-11 NOTE — Telephone Encounter (Signed)
I don't see any labs with urine yesterday.Please advise

## 2019-03-12 LAB — HSV(HERPES SMPLX)ABS-I+II(IGG+IGM)-BLD
HSV 1 Glycoprotein G Ab, IgG: <0.91 index (ref 0.00–0.90)
HSV 2 IgG, Type Spec: <0.91 index (ref 0.00–0.90)
HSVI/II Comb IgM: <0.91 Ratio (ref 0.00–0.90)

## 2019-03-13 NOTE — Telephone Encounter (Signed)
Tamika from [facility] calling to be sure we needed HSV added to pap as she noticed blood work was done 03/10/19.  She doesn't want the pt to be charged twice.  [phone]

## 2019-03-17 ENCOUNTER — Encounter: Payer: Self-pay | Admitting: Family Medicine

## 2019-03-17 LAB — CYTOLOGY - PAP
Adequacy: ABSENT
Chlamydia: NEGATIVE
Comment: NEGATIVE
Comment: NEGATIVE
Comment: NEGATIVE
Comment: NORMAL
Diagnosis: NEGATIVE
HSV1: NEGATIVE
HSV2: NEGATIVE
Neisseria Gonorrhea: NEGATIVE
Trichomonas: NEGATIVE

## 2019-03-18 ENCOUNTER — Encounter: Payer: Self-pay | Admitting: Family Medicine

## 2019-03-18 ENCOUNTER — Ambulatory Visit (INDEPENDENT_AMBULATORY_CARE_PROVIDER_SITE_OTHER): Payer: Self-pay | Admitting: Family Medicine

## 2019-03-18 ENCOUNTER — Other Ambulatory Visit: Payer: Self-pay

## 2019-03-18 VITALS — BP 154/78 | HR 98 | Temp 98.1°F | Ht 68.0 in | Wt 268.8 lb

## 2019-03-18 DIAGNOSIS — F411 Generalized anxiety disorder: Secondary | ICD-10-CM

## 2019-03-18 DIAGNOSIS — R1030 Lower abdominal pain, unspecified: Secondary | ICD-10-CM

## 2019-03-18 DIAGNOSIS — R300 Dysuria: Secondary | ICD-10-CM

## 2019-03-18 LAB — POCT URINALYSIS DIPSTICK
Bilirubin, UA: NEGATIVE
Blood, UA: NEGATIVE
Glucose, UA: NEGATIVE
Ketones, UA: NEGATIVE
Leukocytes, UA: NEGATIVE
Nitrite, UA: NEGATIVE
Protein, UA: POSITIVE — AB
Spec Grav, UA: 1.025 (ref 1.010–1.025)
Urobilinogen, UA: 0.2 E.U./dL
pH, UA: 6 (ref 5.0–8.0)

## 2019-03-18 MED ORDER — SULFAMETHOXAZOLE-TRIMETHOPRIM 800-160 MG PO TABS: 1.0000 | ORAL_TABLET | Freq: Two times a day (BID) | ORAL | 0 refills | Status: DC

## 2019-03-18 NOTE — Patient Instructions (Signed)
Good to see you today  I have sent in an antibiotic to your pharmacy, if symptoms not resolved by beginning of next week.   Increase water intake until urine is light yellow  Call your insurance carrier to check on mental health coverage

## 2019-03-18 NOTE — Progress Notes (Signed)
Subjective:    Patient ID: Faith Morris, female    DOB: Jun 18, 1995, 24 y.o.   MRN: [id number]  HPI Chief Complaint  Patient presents with  . Urinary Tract Infection    Pt states that she had a pap done last week and not long after she developed issues with her urination. pt c/o abd discomfort and UTI symptoms. Pelvic pain/pressure -- per GYN notes she was tested for STDs - all negative. Urine collected today to check dipstick   This is a 24 year old female who presents today with above chief complaint.  She is accompanied by her boyfriend.  Patient with lower abdominal pressure, intermittent dysuria, no hematuria, no fever, no back pain, no nausea or vomiting.  Was seen by GYN last week and per patient, all testing was negative.  Poor fluid intake, just has not been drinking as much water as she typically does.  Bowel movements alternate between diarrhea and constipation which is not unusual for her.  Anxiety-patient with increased anxiety.  Her mother passed away unexpectedly 2018-12-17.  She relied heavily on her mother and also relies heavily on her grandmother for emotional support.  She was temporarily without health insurance and is not sure if her new health insurance covers any behavioral health services.   Review of Systems Per HPI    Objective:   Physical Exam Vitals reviewed.  Constitutional:      General: She is not in acute distress.    Appearance: Normal appearance. She is obese. She is not ill-appearing, toxic-appearing or diaphoretic.  HENT:     Head: Normocephalic and atraumatic.  Cardiovascular:     Rate and Rhythm: Normal rate.  Pulmonary:     Effort: Pulmonary effort is normal.  Abdominal:     General: There is no distension.     Palpations: Abdomen is soft. There is no mass.     Tenderness: There is no abdominal tenderness. There is no right CVA tenderness, left CVA tenderness, guarding or rebound.  Skin:    General: Skin is warm and dry.   Neurological:     Mental Status: She is alert and oriented to person, place, and time.  Psychiatric:        Behavior: Behavior normal.        Thought Content: Thought content normal.        Judgment: Judgment normal.     Comments: Upset and intermittently crying during visit.       BP (!) 154/78 (BP Location: Left Arm, Patient Position: Sitting, Cuff Size: Large)   Pulse 98   Temp 98.1 F (36.7 C) (Temporal)   Ht 5\' 8"  (1.727 m)   Wt 268 lb 12.8 oz (121.9 kg)   SpO2 97%   BMI 40.87 kg/m   Wt Readings from Last 3 Encounters:  03/18/19 268 lb 12.8 oz (121.9 kg)  03/09/19 270 lb (122.5 kg)  11/03/18 260 lb 12.8 oz (118.3 kg)    Results for orders placed or performed in visit on 03/18/19  Urinalysis Dipstick  Result Value Ref Range   Color, UA dark yellow    Clarity, UA concentrated    Glucose, UA Negative Negative   Bilirubin, UA neg    Ketones, UA neg    Spec Grav, UA 1.025 1.010 - 1.025   Blood, UA neg    pH, UA 6.0 5.0 - 8.0   Protein, UA Positive (A) Negative   Urobilinogen, UA 0.2 0.2 or 1.0 E.U./dL   Nitrite, UA neg  Leukocytes, UA Negative Negative   Appearance     Odor        Assessment & Plan:  1. Lower abdominal pain - Urinalysis Dipstick  2. Dysuria -Discussed urinalysis results.  We will go ahead and treat based on symptoms. -Follow-up/RTC precautions reviewed with patient - sulfamethoxazole-trimethoprim (BACTRIM DS) 800-160 MG tablet; Take 1 tablet by mouth 2 (two) times daily.  Dispense: 10 tablet; Refill: 0  3. GAD (generalized anxiety disorder) -She is struggling with the loss of her mother.  She was advised to call her insurance carrier to see what behavioral health resources are covered with her plan -She will let me know if she needs a referral  This visit occurred during the SARS-CoV-2 public health emergency.  Safety protocols were in place, including screening questions prior to the visit, additional usage of staff PPE, and extensive  cleaning of exam room while observing appropriate contact time as indicated for disinfecting solutions.      Olean Ree, FNP-BC  [facility] at [facility], [facility]  03/18/2019 4:57 PM

## 2019-03-18 NOTE — Telephone Encounter (Signed)
Left a detailed message requesting a call back to schedule an appt today Can do in office as long as no Covid sx's  Pt advised to call back to schedule.

## 2019-03-19 ENCOUNTER — Encounter: Payer: Self-pay | Admitting: Family Medicine

## 2019-03-19 ENCOUNTER — Emergency Department: Payer: PRIVATE HEALTH INSURANCE

## 2019-03-19 ENCOUNTER — Other Ambulatory Visit: Payer: Self-pay

## 2019-03-19 DIAGNOSIS — R079 Chest pain, unspecified: Secondary | ICD-10-CM | POA: Insufficient documentation

## 2019-03-19 DIAGNOSIS — Z5321 Procedure and treatment not carried out due to patient leaving prior to being seen by health care provider: Secondary | ICD-10-CM | POA: Insufficient documentation

## 2019-03-19 LAB — CBC
HCT: 39.3 % (ref 36.0–46.0)
Hemoglobin: 13 g/dL (ref 12.0–15.0)
MCH: 26.2 pg (ref 26.0–34.0)
MCHC: 33.1 g/dL (ref 30.0–36.0)
MCV: 79.2 fL — ABNORMAL LOW (ref 80.0–100.0)
Platelets: 448 10*3/uL — ABNORMAL HIGH (ref 150–400)
RBC: 4.96 MIL/uL (ref 3.87–5.11)
RDW: 13.3 % (ref 11.5–15.5)
WBC: 11.5 10*3/uL — ABNORMAL HIGH (ref 4.0–10.5)
nRBC: 0 % (ref 0.0–0.2)

## 2019-03-19 LAB — TROPONIN I (HIGH SENSITIVITY)
Troponin I (High Sensitivity): <2 ng/L (ref <18)
Troponin I (High Sensitivity): <2 ng/L (ref <18)

## 2019-03-19 LAB — COMPREHENSIVE METABOLIC PANEL
ALT: 26 U/L (ref 0–44)
AST: 21 U/L (ref 15–41)
Albumin: 4.2 g/dL (ref 3.5–5.0)
Alkaline Phosphatase: 60 U/L (ref 38–126)
Anion gap: 12 (ref 5–15)
BUN: 13 mg/dL (ref 6–20)
CO2: 18 mmol/L — ABNORMAL LOW (ref 22–32)
Calcium: 9.4 mg/dL (ref 8.9–10.3)
Chloride: 107 mmol/L (ref 98–111)
Creatinine, Ser: 0.65 mg/dL (ref 0.44–1.00)
GFR calc Af Amer: >60 mL/min (ref >60)
GFR calc non Af Amer: >60 mL/min (ref >60)
Glucose, Bld: 101 mg/dL — ABNORMAL HIGH (ref 70–99)
Potassium: 3.9 mmol/L (ref 3.5–5.1)
Sodium: 137 mmol/L (ref 135–145)
Total Bilirubin: 0.4 mg/dL (ref 0.3–1.2)
Total Protein: 8.1 g/dL (ref 6.5–8.1)

## 2019-03-19 LAB — LIPASE, BLOOD: Lipase: 26 U/L (ref 11–51)

## 2019-03-19 LAB — FIBRIN DERIVATIVES D-DIMER (ARMC ONLY): Fibrin derivatives D-dimer (ARMC): 482.06 ng/mL (FEU) (ref 0.00–499.00)

## 2019-03-19 NOTE — ED Triage Notes (Addendum)
Pt in with chest pain that started yesterday and vomited x 1. Pt does have hx of anxiety but states has never had same symptoms. Pt was started on septra for possible UTI yesterday. States cp radiates to epigastric area and does have shob at times.

## 2019-03-20 ENCOUNTER — Encounter: Payer: Self-pay | Admitting: Family Medicine

## 2019-03-20 ENCOUNTER — Other Ambulatory Visit: Payer: Self-pay | Admitting: Family Medicine

## 2019-03-20 ENCOUNTER — Emergency Department
Admission: EM | Admit: 2019-03-20 | Discharge: 2019-03-20 | Disposition: A | Discharge status: Home / Self Care | Payer: PRIVATE HEALTH INSURANCE | Attending: Emergency Medicine | Admitting: Emergency Medicine

## 2019-03-20 ENCOUNTER — Telehealth: Payer: Self-pay | Admitting: Emergency Medicine

## 2019-03-20 DIAGNOSIS — R1030 Lower abdominal pain, unspecified: Secondary | ICD-10-CM

## 2019-03-20 DIAGNOSIS — R300 Dysuria: Secondary | ICD-10-CM

## 2019-03-20 NOTE — Telephone Encounter (Signed)
Called patient due to lwot to inquire about condition and follow up plans. Says she feels better and is getting in contact with her pcp now.  I told her that lab results are available for her doctor as well.

## 2019-03-23 ENCOUNTER — Encounter: Payer: Self-pay | Admitting: Family Medicine

## 2019-05-09 ENCOUNTER — Encounter: Payer: Self-pay | Admitting: Family Medicine

## 2019-05-11 ENCOUNTER — Encounter: Payer: Self-pay | Admitting: Family Medicine

## 2019-05-12 ENCOUNTER — Encounter: Payer: Self-pay | Admitting: Family Medicine

## 2019-05-12 ENCOUNTER — Other Ambulatory Visit: Payer: Self-pay | Admitting: Family Medicine

## 2019-05-12 MED ORDER — MECLIZINE HCL 25 MG PO TABS: 25.0000 mg | ORAL_TABLET | Freq: Three times a day (TID) | ORAL | 0 refills | Status: DC | PRN

## 2019-08-05 ENCOUNTER — Encounter: Payer: Self-pay | Admitting: Family Medicine

## 2019-08-08 NOTE — Telephone Encounter (Signed)
Please see mychart message from patient. Do you know what she means about e requesting the records? I didn't find a matching location in Care Everywhere.

## 2019-09-10 ENCOUNTER — Telehealth: Payer: Self-pay | Admitting: Family Medicine

## 2019-09-10 NOTE — Telephone Encounter (Signed)
Patient called wanting to see if her orders from [facility] in [place] were faxed over. I checked the cart but was not able to see them faxed over yet. Please call patient when you've received orders!

## 2019-09-11 ENCOUNTER — Other Ambulatory Visit: Payer: Self-pay

## 2019-09-11 ENCOUNTER — Ambulatory Visit (INDEPENDENT_AMBULATORY_CARE_PROVIDER_SITE_OTHER): Payer: PRIVATE HEALTH INSURANCE | Admitting: Family Medicine

## 2019-09-11 ENCOUNTER — Encounter: Payer: Self-pay | Admitting: Family Medicine

## 2019-09-11 VITALS — BP 124/86 | HR 87 | Temp 98.3°F | Wt 274.0 lb

## 2019-09-11 DIAGNOSIS — E790 Hyperuricemia without signs of inflammatory arthritis and tophaceous disease: Secondary | ICD-10-CM | POA: Diagnosis not present

## 2019-09-11 DIAGNOSIS — R7989 Other specified abnormal findings of blood chemistry: Secondary | ICD-10-CM

## 2019-09-11 NOTE — Progress Notes (Signed)
   Subjective:    Patient ID: Faith Morris, female    DOB: [birth year], 24 y.o.   MRN: [id number]  HPI Chief Complaint  Patient presents with  . Follow-up    going to weight mgmt had recent labs were abnormal... Uric Acid 7.1--09/04/2019....TSH--12.8.... T4--12.8.Marland KitchenMarland Kitchen Total Chol--162... Trigly--127... LDL--22...Marland KitchenHDL--56....   Currently going to [facility]. Working out and watching calories. Hcg shots, B12. Has been going for a week and has lost 3 pounds. She was told by the clinic to have follow up for "abnormal labs." Concern about thyroid and uric acid.   Good energy, mood improved, she has been working out and meal prep/ planning with her grandmother which has been good for both of them.   Review of Systems Per HPI    Objective:   Physical Exam Vitals reviewed.  Constitutional:      Appearance: Normal appearance. She is obese.  HENT:     Head: Normocephalic and atraumatic.  Cardiovascular:     Rate and Rhythm: Normal rate.  Pulmonary:     Effort: Pulmonary effort is normal.  Neurological:     Mental Status: She is alert and oriented to person, place, and time.  Psychiatric:        Mood and Affect: Mood normal.        Behavior: Behavior normal.        Thought Content: Thought content normal.        Judgment: Judgment normal.       BP 124/86   Pulse 87   Temp 98.3 F (36.8 C) (Temporal)   Wt 274 lb (124.3 kg)   SpO2 98%   BMI 41.66 kg/m  Wt Readings from Last 3 Encounters:  09/11/19 274 lb (124.3 kg)  03/19/19 268 lb (121.6 kg)  03/18/19 268 lb 12.8 oz (121.9 kg)       Assessment & Plan:  1. Elevated TSH - she is going to send me copy of her labs so I can see reference ranges, will then determine next steps  2. Elevated blood uric acid level - will have rechecked at [facility], no symptoms of gout currently  3. Morbid obesity (HCC) - applauded and encouraged her efforts, discussed sustainability and consistency.  - follow up  prn  This visit occurred during the SARS-CoV-2 public health emergency.  Safety protocols were in place, including screening questions prior to the visit, additional usage of staff PPE, and extensive cleaning of exam room while observing appropriate contact time as indicated for disinfecting solutions.      Olean Ree, FNP-BC  [facility] at [facility], [facility]  09/12/2019 8:08 AM

## 2019-09-11 NOTE — Patient Instructions (Signed)
Good to see you today  Keep up the good work with your diet and exercise  Send me your results via mychart

## 2019-09-11 NOTE — Telephone Encounter (Signed)
Patient seen in office today. 

## 2019-09-12 ENCOUNTER — Encounter: Payer: Self-pay | Admitting: Family Medicine

## 2019-09-12 DIAGNOSIS — F419 Anxiety disorder, unspecified: Secondary | ICD-10-CM | POA: Insufficient documentation

## 2019-09-12 DIAGNOSIS — F321 Major depressive disorder, single episode, moderate: Secondary | ICD-10-CM | POA: Insufficient documentation

## 2019-10-29 ENCOUNTER — Encounter: Payer: Self-pay | Admitting: Family Medicine

## 2019-10-30 ENCOUNTER — Encounter: Payer: Self-pay | Admitting: Family Medicine

## 2019-10-30 DIAGNOSIS — U071 COVID-19: Secondary | ICD-10-CM | POA: Insufficient documentation

## 2019-11-10 ENCOUNTER — Encounter: Payer: Self-pay | Admitting: Family Medicine

## 2019-11-13 ENCOUNTER — Encounter: Payer: Self-pay | Admitting: Family Medicine

## 2019-11-13 ENCOUNTER — Other Ambulatory Visit: Payer: Self-pay

## 2019-11-13 ENCOUNTER — Ambulatory Visit (INDEPENDENT_AMBULATORY_CARE_PROVIDER_SITE_OTHER): Payer: PRIVATE HEALTH INSURANCE | Admitting: Family Medicine

## 2019-11-13 VITALS — BP 126/80 | Temp 97.9°F | Ht 68.0 in | Wt 269.5 lb

## 2019-11-13 DIAGNOSIS — Z0000 Encounter for general adult medical examination without abnormal findings: Secondary | ICD-10-CM

## 2019-11-13 DIAGNOSIS — Z3202 Encounter for pregnancy test, result negative: Secondary | ICD-10-CM | POA: Diagnosis not present

## 2019-11-13 DIAGNOSIS — Z113 Encounter for screening for infections with a predominantly sexual mode of transmission: Secondary | ICD-10-CM | POA: Diagnosis not present

## 2019-11-13 DIAGNOSIS — Z23 Encounter for immunization: Secondary | ICD-10-CM | POA: Diagnosis not present

## 2019-11-13 DIAGNOSIS — R350 Frequency of micturition: Secondary | ICD-10-CM | POA: Diagnosis not present

## 2019-11-13 LAB — POC URINALSYSI DIPSTICK (AUTOMATED)
Bilirubin, UA: NEGATIVE
Blood, UA: NEGATIVE
Glucose, UA: NEGATIVE
Ketones, UA: NEGATIVE
Leukocytes, UA: NEGATIVE
Nitrite, UA: NEGATIVE
Protein, UA: POSITIVE — AB
Spec Grav, UA: >=1.03 — AB (ref 1.010–1.025)
Urobilinogen, UA: 0.2 E.U./dL
pH, UA: 6 (ref 5.0–8.0)

## 2019-11-13 LAB — TIQ-NTM

## 2019-11-13 LAB — POCT URINE PREGNANCY: Preg Test, Ur: NEGATIVE

## 2019-11-13 NOTE — Patient Instructions (Addendum)
Send me your Covid immunization dates through [place] so I can put them in the computer.   I will notify you of lab results  Keep up the good work with diet and exercise

## 2019-11-13 NOTE — Progress Notes (Signed)
Subjective:    Patient ID: Faith Morris, female    DOB: 1995/07/09, 24 y.o.   MRN: [id number]  HPI Chief Complaint  Patient presents with  . Urinary Frequency    x 1week, uncomfortable feeling with pt urinate  . Gynecologic Exam    had sex and condom came off and stuck in pt vaginal area.     This is a 24 yo female who presents today for annual exam and with above cc.   Last CPE- years ago per patient Pap- 2/21 with gyn Tdap-  Unsure, will have today Flu- today Covid 19 vaccine- fully vaccinated Exercise- regular  Had protected intercourse with new partner. Condom slipped off. She requests STI screening today. Has had 8 lifetime partners. Always uses condoms with new partners. Has had chlamydia in the past.    Review of Systems  Constitutional: Negative.   HENT: Negative.   Eyes: Negative.   Respiratory: Negative.   Cardiovascular: Negative.   Gastrointestinal: Negative.   Endocrine: Negative.   Genitourinary: Positive for frequency. Negative for dysuria, vaginal bleeding and vaginal discharge.  Musculoskeletal: Negative.   Skin: Negative.   Allergic/Immunologic: Negative.   Neurological: Negative.   Hematological: Negative.   Psychiatric/Behavioral: Negative for dysphoric mood. The patient is nervous/anxious (longstanding, working on relaxation, exercise).        Objective:   Physical Exam Vitals reviewed. Exam conducted with a chaperone present De Nurse, CMA).  Constitutional:      Appearance: Normal appearance. She is obese.  HENT:     Head: Normocephalic and atraumatic.     Right Ear: Tympanic membrane, ear canal and external ear normal.     Left Ear: Tympanic membrane, ear canal and external ear normal.     Nose: Nose normal.     Mouth/Throat:     Mouth: Mucous membranes are moist.     Pharynx: Oropharynx is clear.  Eyes:     Conjunctiva/sclera: Conjunctivae normal.  Cardiovascular:     Rate and Rhythm: Normal rate and regular rhythm.      Heart sounds: Normal heart sounds.  Pulmonary:     Effort: Pulmonary effort is normal.     Breath sounds: Normal breath sounds.  Abdominal:     Hernia: There is no hernia in the left inguinal area or right inguinal area.  Genitourinary:    General: Normal vulva.     Exam position: Supine.     Pubic Area: No rash or pubic lice.      Labia:        Right: No rash, tenderness, lesion or injury.        Left: No rash, tenderness, lesion or injury.      Urethra: No prolapse, urethral pain, urethral swelling or urethral lesion.     Vagina: Normal. No vaginal discharge.     Cervix: Normal.  Musculoskeletal:        General: Normal range of motion.     Cervical back: Normal range of motion and neck supple.  Lymphadenopathy:     Lower Body: No right inguinal adenopathy. No left inguinal adenopathy.  Skin:    General: Skin is warm and dry.  Neurological:     Mental Status: She is alert and oriented to person, place, and time.  Psychiatric:        Mood and Affect: Mood normal.        Behavior: Behavior normal.        Thought Content: Thought content normal.  Judgment: Judgment normal.       BP 126/80   Temp 97.9 F (36.6 C) (Temporal)   Ht 5\' 8"  (1.727 m)   Wt 269 lb 8 oz (122.2 kg)   LMP 10/26/2019 (Approximate)   BMI 40.98 kg/m  Wt Readings from Last 3 Encounters:  11/13/19 269 lb 8 oz (122.2 kg)  09/11/19 274 lb (124.3 kg)  03/19/19 268 lb (121.6 kg)   Depression screen [facility] 2/9 11/13/2019 11/03/2018  Decreased Interest 0 1  Down, Depressed, Hopeless 0 1  PHQ - 2 Score 0 2  Altered sleeping - 0  Tired, decreased energy - 1  Change in appetite - 0  Feeling bad or failure about yourself  - 1  Trouble concentrating - 3  Moving slowly or fidgety/restless - 3  Suicidal thoughts - 0  PHQ-9 Score - 10      Assessment & Plan:  1. Annual physical exam - Discussed and encouraged healthy lifestyle choices- adequate sleep, regular exercise, stress management and healthy  food choices.    2. Routine screening for STI (sexually transmitted infection) - encouraged continued safe sex practices - HIV Antibody (routine testing w rflx) - RPR - C. trachomatis/N. gonorrhoeae RNA - Trichomonas vaginalis, RNA  3. Need for influenza vaccination - Flu Vaccine QUAD 6+ mos PF IM (Fluarix Quad PF)  4. Need for Tdap vaccination - Tdap vaccine greater than or equal to 7yo IM  5. Urinary frequency - UA normal - POCT Urinalysis Dipstick (Automated)  6. Urine pregnancy test negative - POCT urine pregnancy  This visit occurred during the SARS-CoV-2 public health emergency.  Safety protocols were in place, including screening questions prior to the visit, additional usage of staff PPE, and extensive cleaning of exam room while observing appropriate contact time as indicated for disinfecting solutions.    11/05/2018, FNP-BC  [facility], [facility]  11/13/2019 8:43 AM

## 2019-11-14 ENCOUNTER — Encounter: Payer: Self-pay | Admitting: Family Medicine

## 2019-11-16 ENCOUNTER — Encounter: Payer: Self-pay | Admitting: Family Medicine

## 2019-11-16 ENCOUNTER — Telehealth: Payer: Self-pay | Admitting: Radiology

## 2019-11-16 LAB — RPR: RPR Ser Ql: NONREACTIVE

## 2019-11-16 LAB — HIV ANTIBODY (ROUTINE TESTING W REFLEX): HIV 1&2 Ab, 4th Generation: NONREACTIVE

## 2019-11-16 NOTE — Telephone Encounter (Signed)
[facility] called, the wrong swab was used for the std tests, I told them to cancel the tests, An e swab was submitted, that is not the correct swab.

## 2019-11-16 NOTE — Telephone Encounter (Signed)
Result note sent to patient regarding lab.

## 2019-11-16 NOTE — Telephone Encounter (Signed)
Per Camelia Eng, patient can schedule a lab only visit and come in for self swab sample collection. Patient notified by mychart.

## 2019-11-17 ENCOUNTER — Encounter: Payer: Self-pay | Admitting: Family Medicine

## 2019-11-17 LAB — TRICHOMONAS VAGINALIS, PROBE AMP: Trichomonas vaginalis RNA: NOT DETECTED

## 2019-11-18 ENCOUNTER — Other Ambulatory Visit: Payer: PRIVATE HEALTH INSURANCE

## 2019-11-18 ENCOUNTER — Other Ambulatory Visit: Payer: Self-pay

## 2019-11-18 DIAGNOSIS — Z113 Encounter for screening for infections with a predominantly sexual mode of transmission: Secondary | ICD-10-CM

## 2019-11-18 LAB — C. TRACHOMATIS/N. GONORRHOEAE RNA

## 2019-11-19 ENCOUNTER — Encounter: Payer: Self-pay | Admitting: Family Medicine

## 2019-11-19 LAB — C. TRACHOMATIS/N. GONORRHOEAE RNA
C. trachomatis RNA, TMA: NOT DETECTED
N. gonorrhoeae RNA, TMA: NOT DETECTED

## 2019-12-13 IMAGING — CT CT HEAD WITHOUT CONTRAST
3 series · 16 of 47 positions shown, 19 images · non-contrast
Comparison: None.

CLINICAL DATA: Pain

EXAM:
CT HEAD WITHOUT CONTRAST
TECHNIQUE: Contiguous axial images were obtained from the base of the skull
through the vertex without intravenous contrast.

[Series 2: head wo · axial · 0.42mm/px · z∈[-125,+0]mm · 10 of 30 slices shown, 13 images]
[im 3/30  brain]
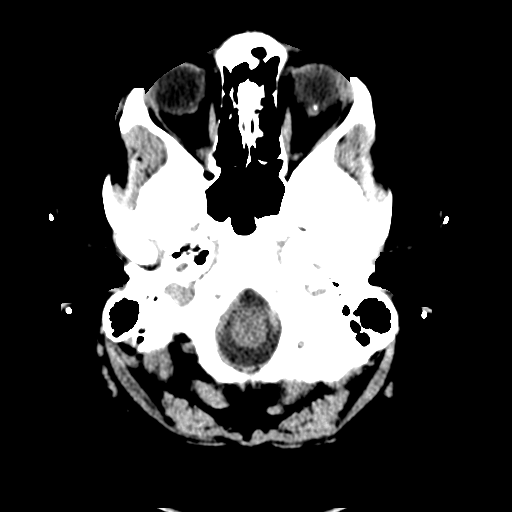
[im 3/30  bone]
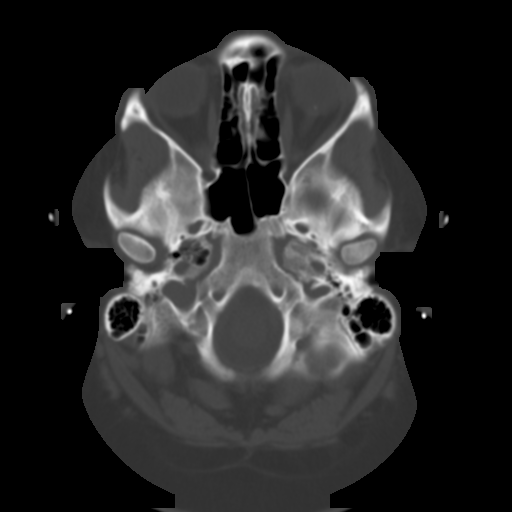
[im 6/30  brain]
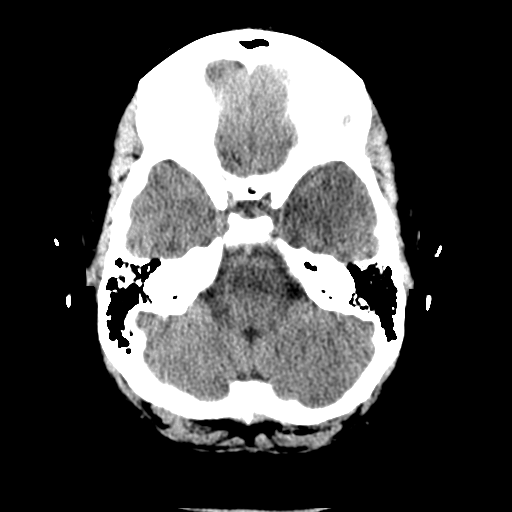
[im 9/30  brain]
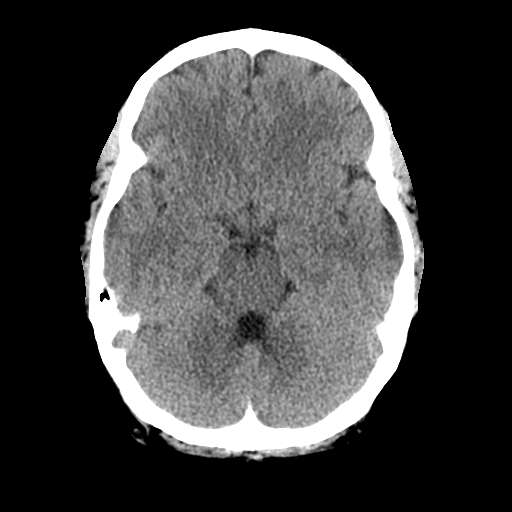
[im 11/30  brain]
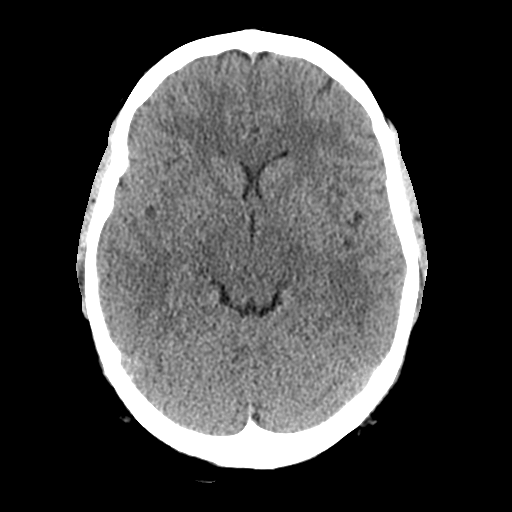
[im 14/30  brain]
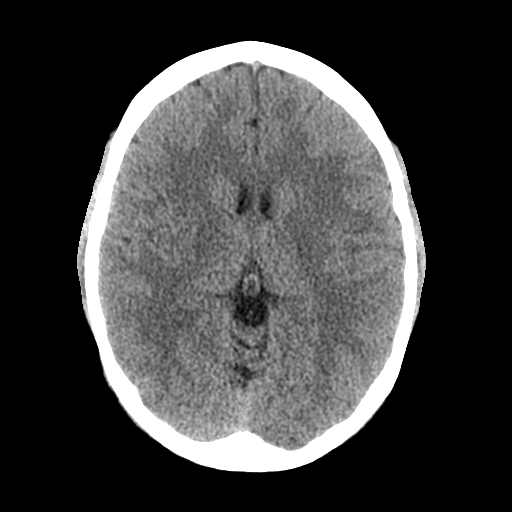
[im 14/30  bone]
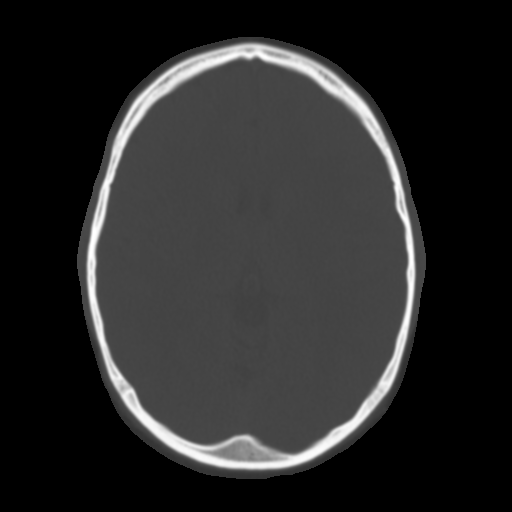
[im 17/30  brain]
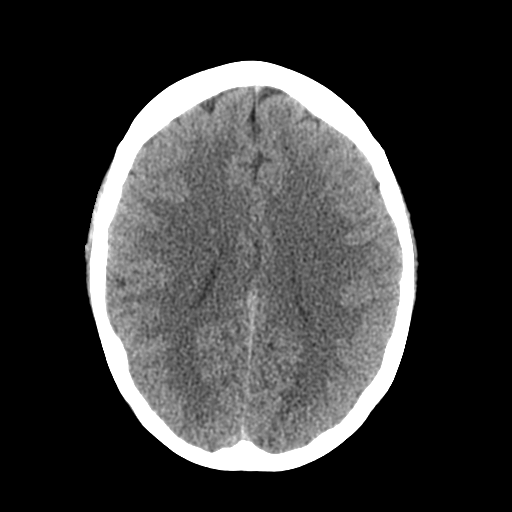
[im 20/30  brain]
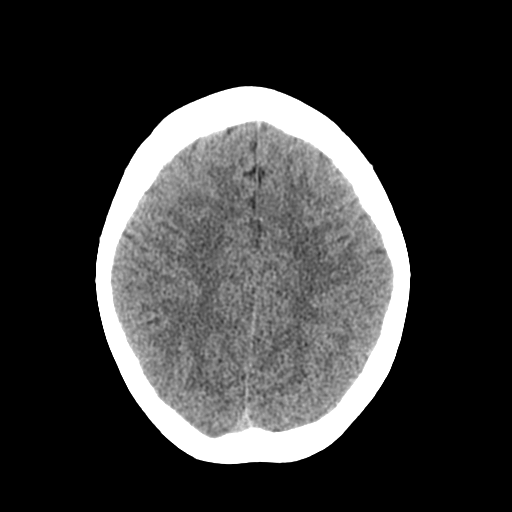
[im 23/30  brain]
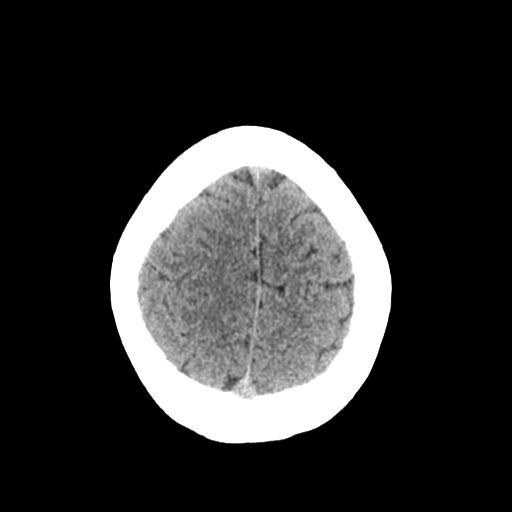
[im 25/30  brain]
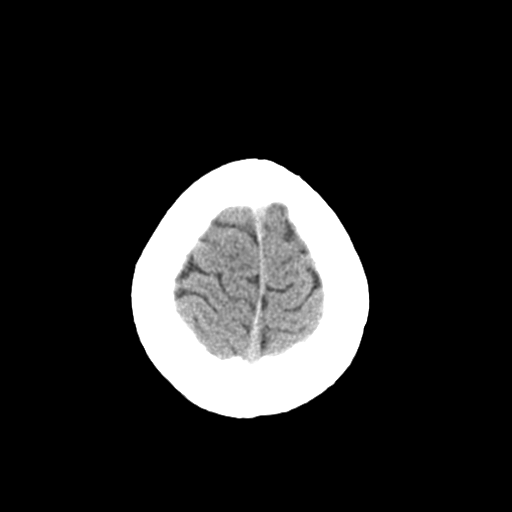
[im 25/30  bone]
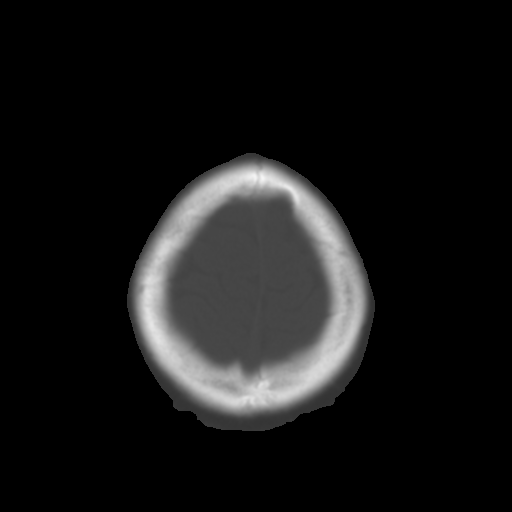
[im 28/30  brain]
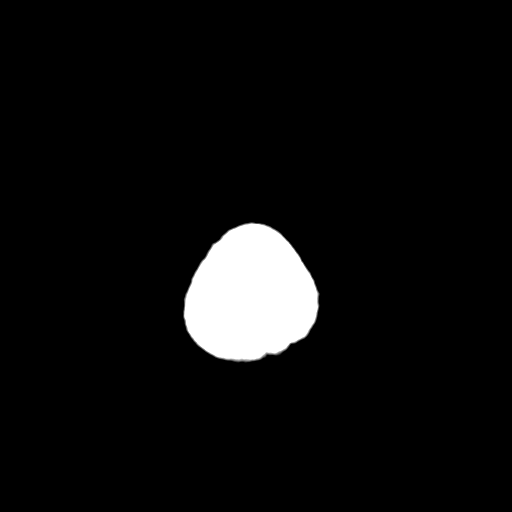

[Series 4: coronal soft tissue · coronal · 0.31mm/px · 3 of 64 slices shown]
[im 22/64  brain]
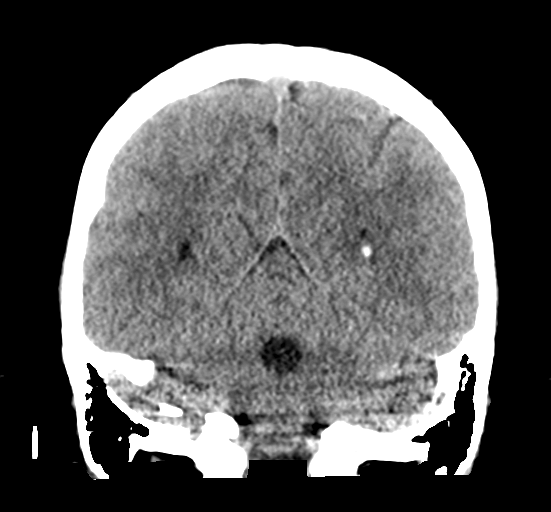
[im 29/64  brain]
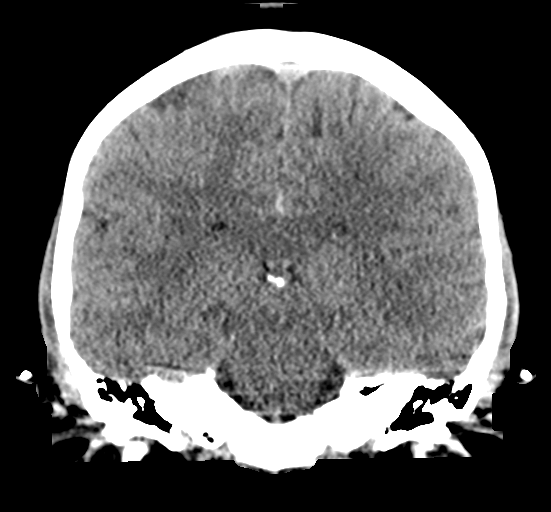
[im 36/64  brain]
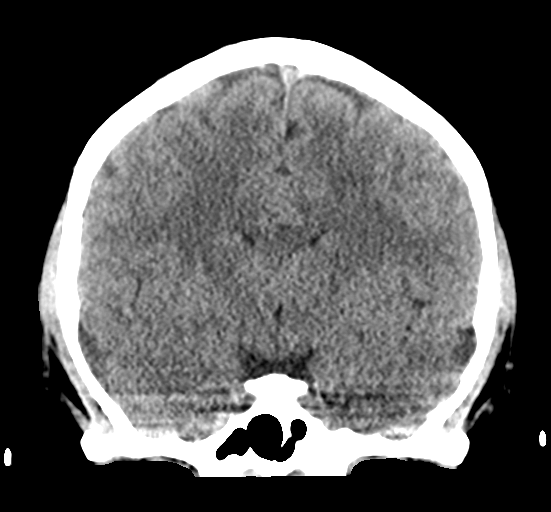

[Series 5: sagittal soft tissue · sagittal · 0.31mm/px · 3 of 53 slices shown]
[im 18/53  brain]
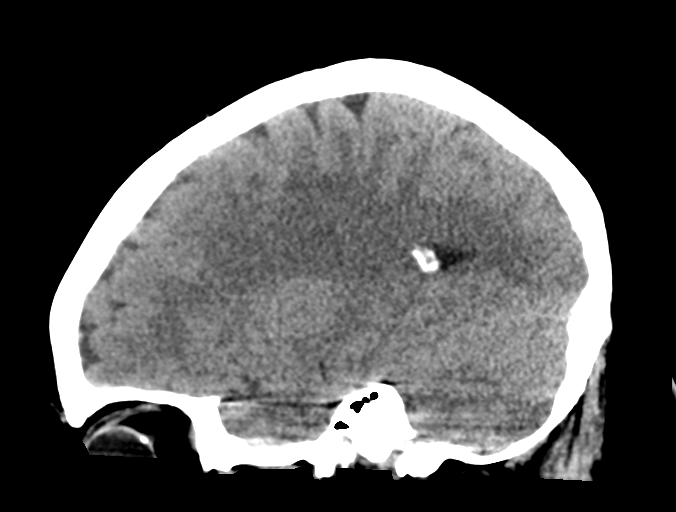
[im 27/53  brain]
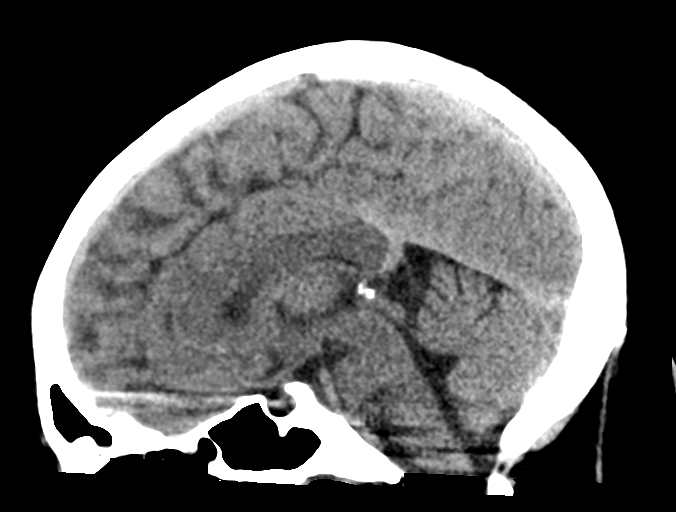
[im 35/53  brain]
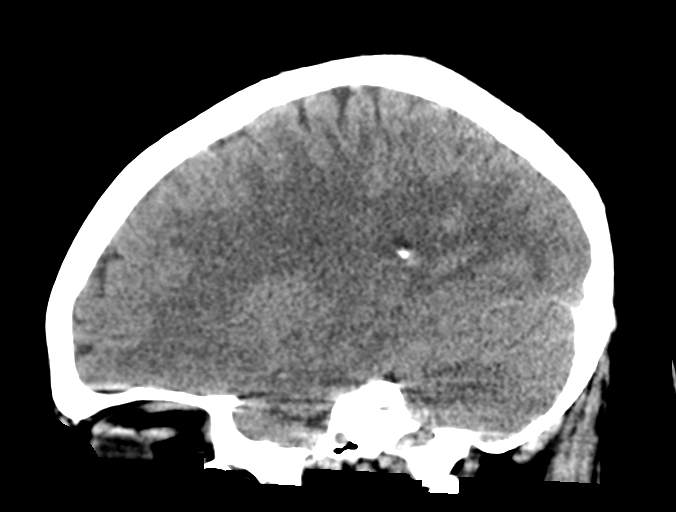

[16 of 47 positions shown; findings below may reference images not displayed]

FINDINGS: Brain: No evidence of acute infarction, hemorrhage, hydrocephalus,
extra-axial collection or mass lesion/mass effect.

Vascular: No hyperdense vessel or unexpected calcification.

Skull: Normal. Negative for fracture or focal lesion.

Sinuses/Orbits: There is mild mucosal thickening of the ethmoid air
cells bilaterally, otherwise the remaining paranasal sinuses and
mastoid air cells are essentially clear.

Other: None.
IMPRESSION: No acute intracranial abnormality.

## 2020-03-04 ENCOUNTER — Telehealth (INDEPENDENT_AMBULATORY_CARE_PROVIDER_SITE_OTHER): Payer: PRIVATE HEALTH INSURANCE | Admitting: Internal Medicine

## 2020-03-04 ENCOUNTER — Other Ambulatory Visit: Payer: Self-pay

## 2020-03-04 ENCOUNTER — Encounter: Payer: Self-pay | Admitting: Internal Medicine

## 2020-03-04 DIAGNOSIS — U071 COVID-19: Secondary | ICD-10-CM

## 2020-03-04 MED ORDER — PREDNISONE 20 MG PO TABS: 40.0000 mg | ORAL_TABLET | Freq: Every day | ORAL | 0 refills | Status: AC

## 2020-03-04 MED ORDER — ALPRAZOLAM 0.25 MG PO TABS: 0.2500 mg | ORAL_TABLET | Freq: Three times a day (TID) | ORAL | 0 refills | Status: AC | PRN

## 2020-03-04 NOTE — Assessment & Plan Note (Addendum)
Having some SOB---but much of that seems to be anxiety Will treat with prednisone due to chest tightness and recent vaping (stopped now) Will give a few xanax for when she seems to be panicking  Recommended the booster when symptoms are improved Urgent care or ER if really can't breathe

## 2020-03-04 NOTE — Progress Notes (Signed)
Subjective:    Patient ID: Faith Morris, female    DOB: 01/24/1996, 24 y.o.   MRN: [id number]  HPI Video virtual visit for respiratory illness Identification done Reviewed billing and limitations and she gave consent Participants--patient in her car (with finacee who is driving) and I am in my office\ Fiancee also positive  She took 3 at home COVID tests 2 were positive Having trouble with her breathing---"I am scared" Hard to talk a lot--it gets her SOB a bit No fever Some cough--may actually help her breathing Some sore throat---better today  Started with malaise 3 nights ago Woke with congestion, some chest tightness Next day (yesterda)---"I felt like a train hit me"---better today  vapes---off now  Using dayquil, nyquil, tussin, throat lozenges, chicken noodle soup  Current Outpatient Medications on File Prior to Visit  Medication Sig Dispense Refill  . citalopram (CELEXA) 20 MG tablet Take 20 mg by mouth daily. (Patient not taking: No sig reported)    . DIETHYLPROPION HCL PO Take by mouth. (Patient not taking: No sig reported)    . drospirenone-ethinyl estradiol (YAZ) 3-0.02 MG tablet Take 1 tablet by mouth daily. (Patient not taking: No sig reported) 3 Package 3  . meclizine (ANTIVERT) 25 MG tablet Take 1 tablet (25 mg total) by mouth 3 (three) times daily as needed for dizziness. May make sleepy, use care driving/ operating heavy machinery. (Patient not taking: No sig reported) 30 tablet 0  . traZODone (DESYREL) 50 MG tablet TK 1 T PO HS (Patient not taking: No sig reported)    . VYVANSE 70 MG capsule TK 1 C PO QAM (Patient not taking: No sig reported)    . [DISCONTINUED] Norethindrone Acetate-Ethinyl Estrad-FE (LOESTRIN 24 FE) 1-20 MG-MCG(24) tablet Take 1 tablet by mouth daily. 3 Package 3   No current facility-administered medications on file prior to visit.    Allergies  Allergen Reactions  . Cephalexin Itching, Swelling and Rash  .  Sulfamethoxazole-Trimethoprim Other (See Comments)    Chest and abdominal pain    Past Medical History:  Diagnosis Date  . ADHD   . Depression   . Eating disorder    takes Vyvanse for overeating.   . Prediabetes     Past Surgical History:  Procedure Laterality Date  . wisdom teeth      Family History  Problem Relation Age of Onset  . Breast cancer Maternal Aunt 60  . Miscarriages / [place] Mother   . Alcohol abuse Father   . Arthritis Father   . Learning disabilities Father   . Depression Sister   . Learning disabilities Sister     Social History   Socioeconomic History  . Marital status: Single    Spouse name: Not on file  . Number of children: Not on file  . Years of education: Not on file  . Highest education level: Not on file  Occupational History  . Not on file  Tobacco Use  . Smoking status: Never Smoker  . Smokeless tobacco: Never Used  Vaping Use  . Vaping Use: Every day  Substance and Sexual Activity  . Alcohol use: No  . Drug use: No  . Sexual activity: Yes    Birth control/protection: Condom, None  Other Topics Concern  . Not on file  Social History Narrative  . Not on file   Social Determinants of Health   Financial Resource Strain: Not on file  Food Insecurity: Not on file  Transportation Needs: Not on file  Physical  Activity: Not on file  Stress: Not on file  Social Connections: Not on file  Intimate Partner Violence: Not on file    Review of Systems  Able to sleep Slight nausea but no vomiting Able to eat No asthma Has severe anxiety----hard with Debbie leaving     Objective:   Physical Exam Constitutional:      Appearance: Normal appearance.  Neurological:     Mental Status: She is alert.  Psychiatric:     Comments: Very anxious            Assessment & Plan:

## 2020-08-02 IMAGING — CR DG CHEST 2V
2 series · 2 of 2 positions shown · non-contrast
Comparison: None.

CLINICAL DATA: Chest pain

EXAM:
CHEST - 2 VIEW

[chest pa]
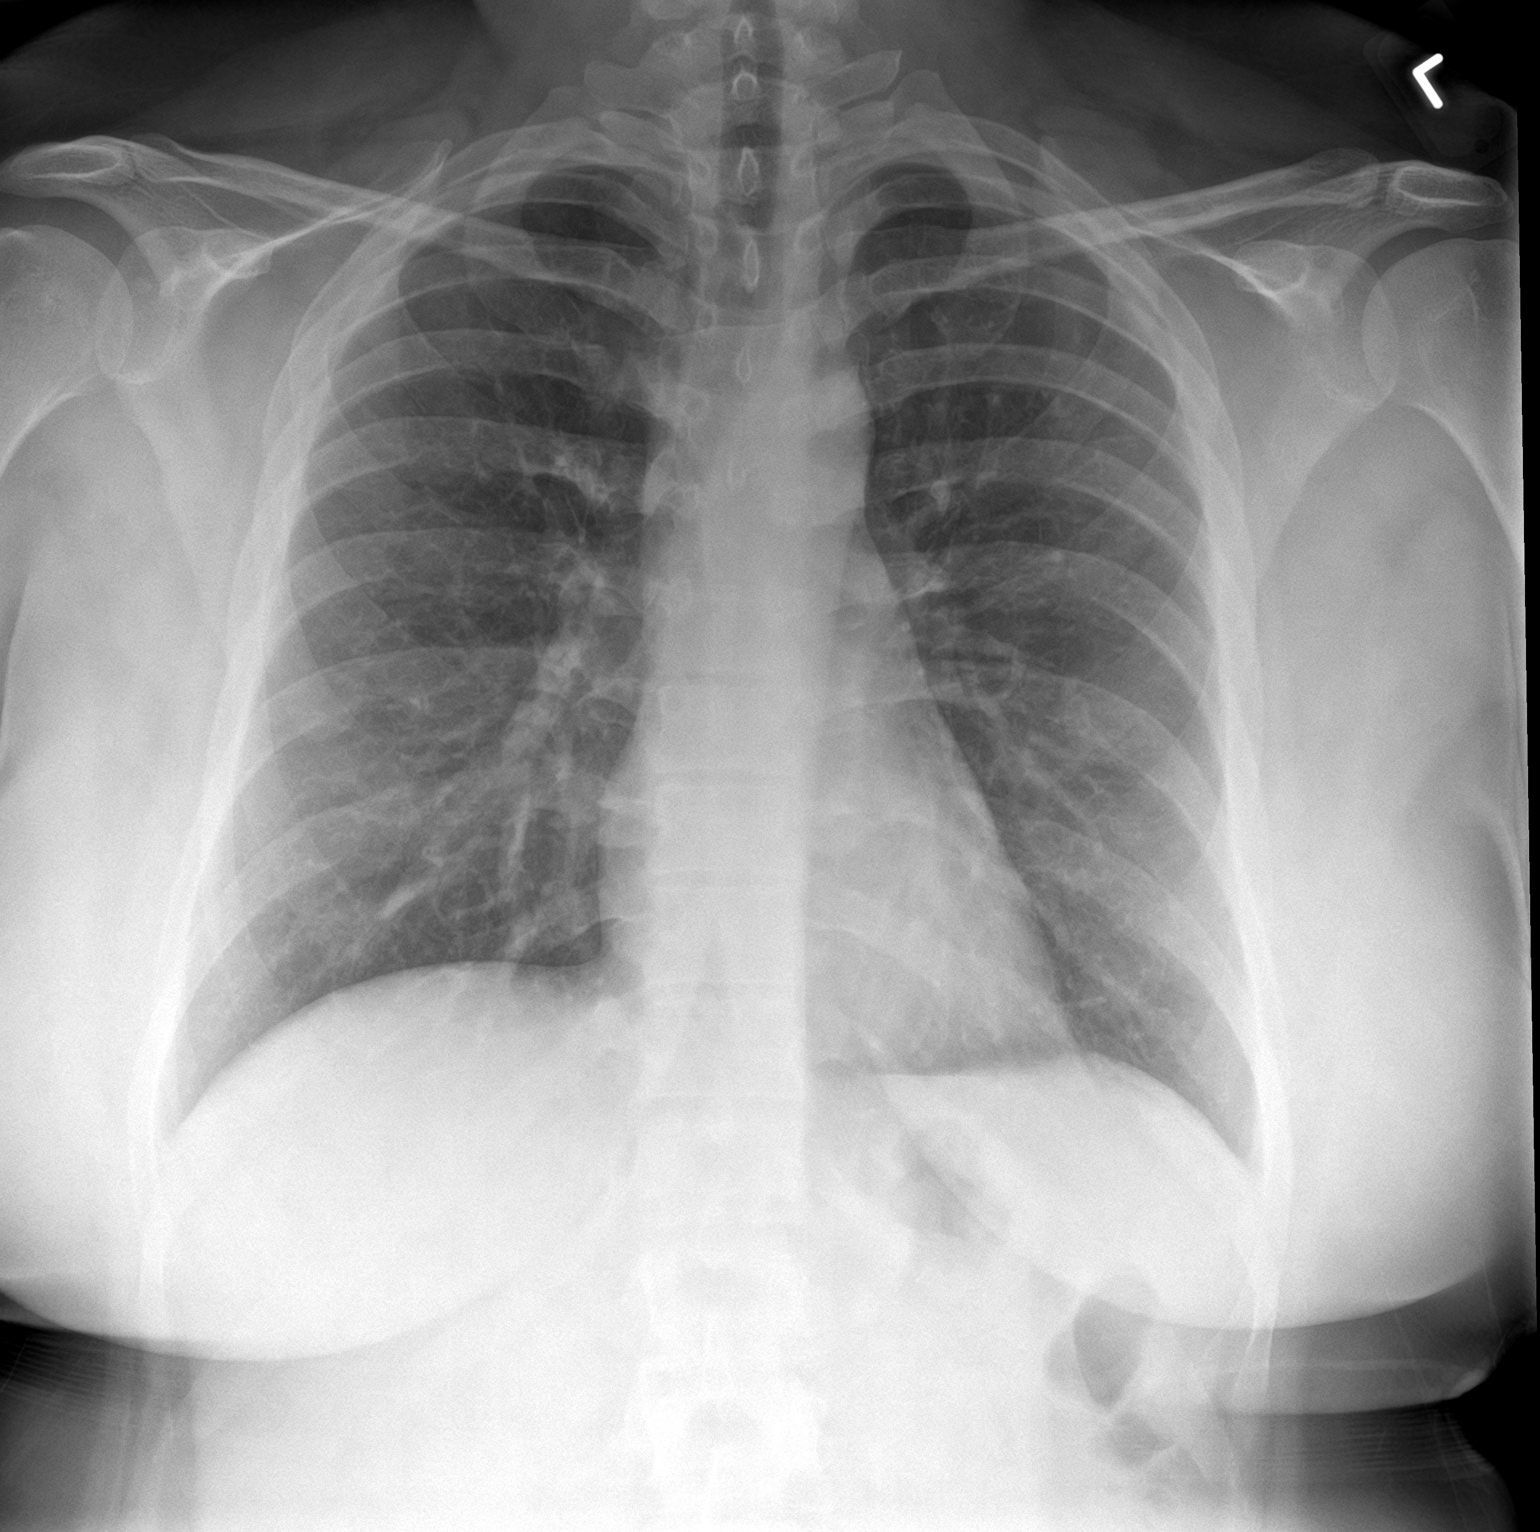

[chest lat]
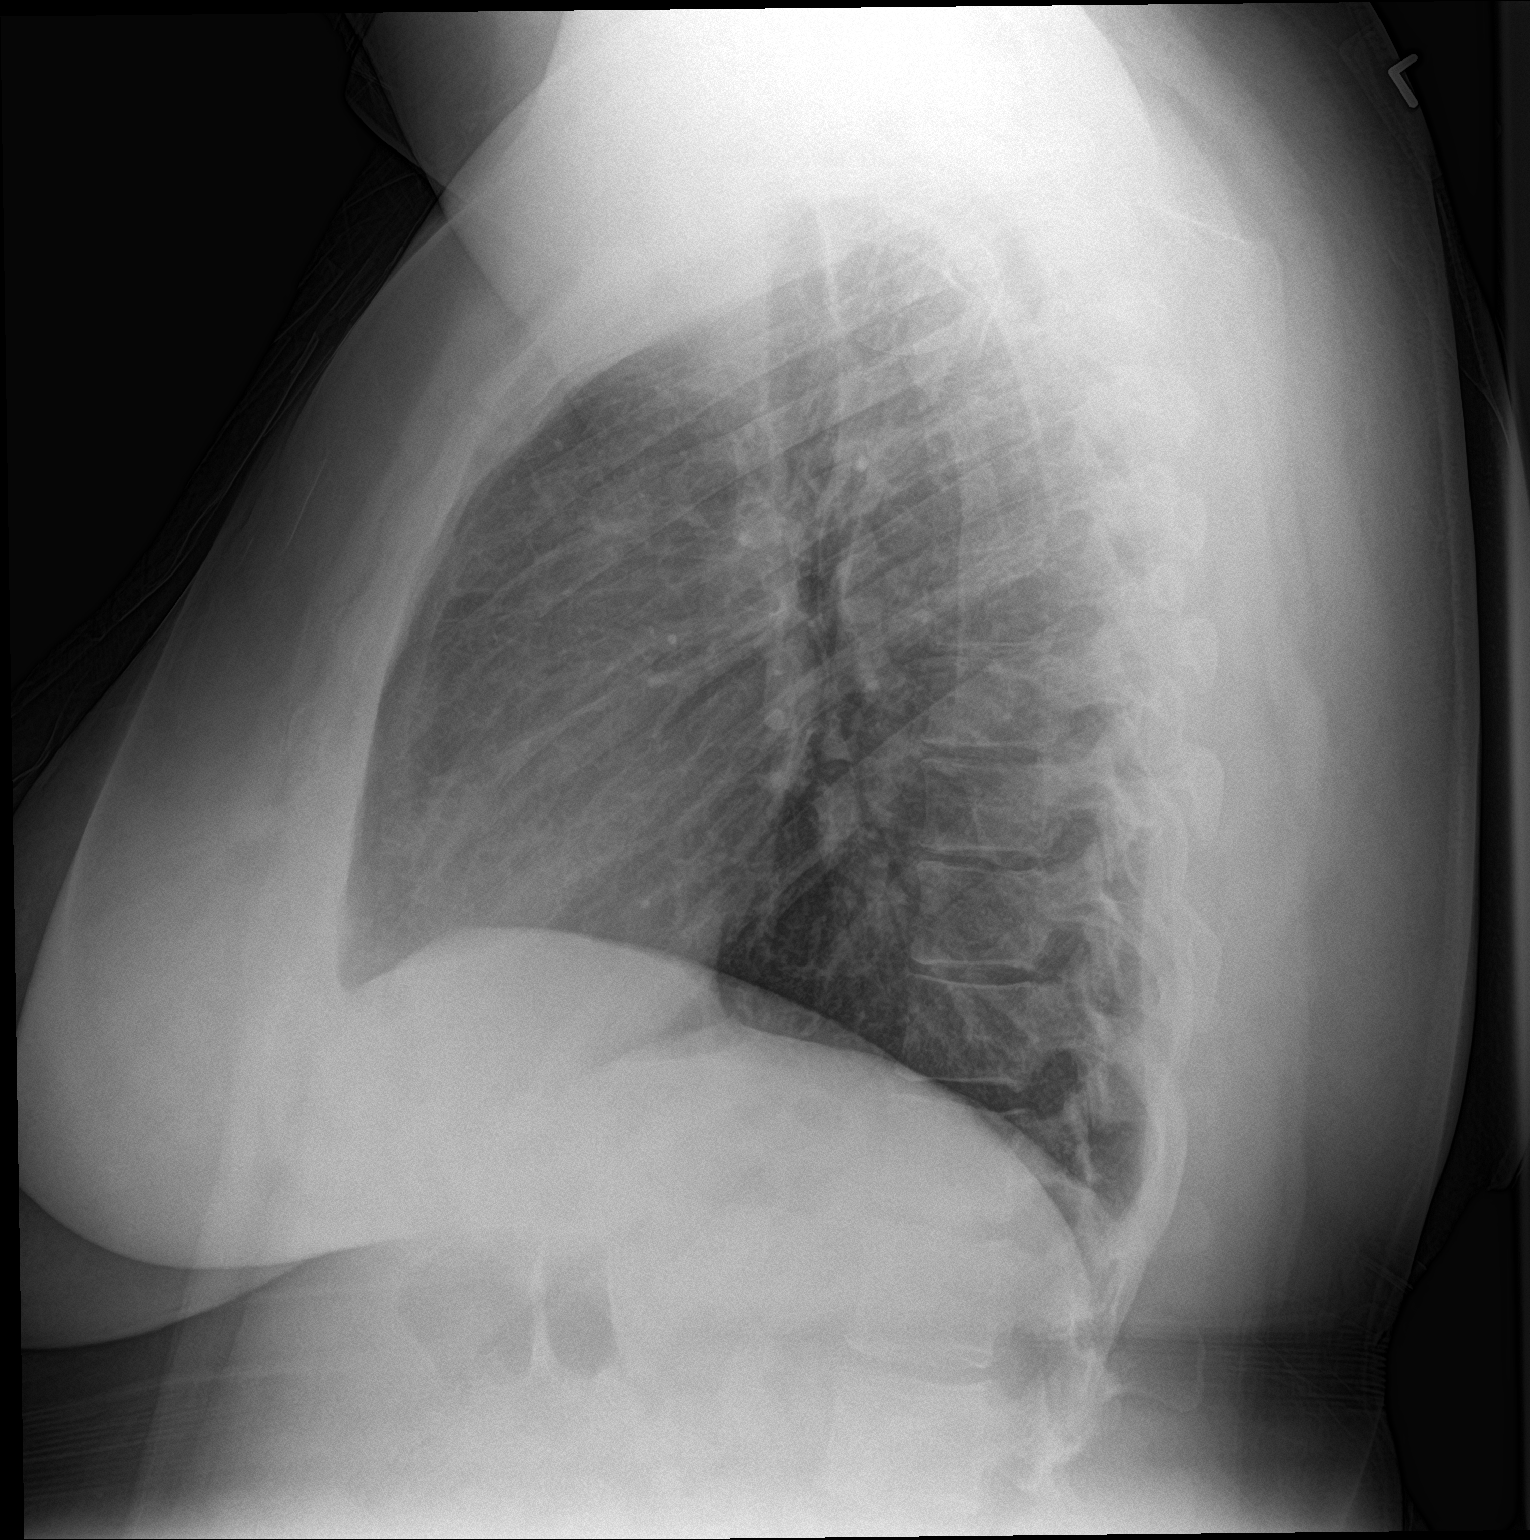

[2 of 2 positions shown; findings below may reference images not displayed]

FINDINGS: The heart size and mediastinal contours are within normal limits.
Both lungs are clear. The visualized skeletal structures are
unremarkable.
IMPRESSION: No active cardiopulmonary disease.

## 2020-08-28 NOTE — ED Notes (Signed)
ED Triage Note       ED Secondary Triage Entered On:  08/28/2020 15:36 EDT    Performed On:  08/28/2020 15:35 EDT by Renato Gails, RN, Jonelle Sports               General Information   Barriers to Learning :   None evident   Languages :   English   Immunizations Current :   Yes   ED Home Meds Section :   Document assessment   [facility] ED Fall Risk Section :   Document assessment   ED History Section :   Document assessment   ED Advance Directives Section :   Document assessment   ED Palliative Screen :   N/A (prefilled for <65yo)   Cletis Athens - 08/28/2020 15:35 EDT   (As Of: 08/28/2020 15:36:36 EDT)   Problems(Active)    ADHD (SNOMED CT  :2595638756 )  Name of Problem:   ADHD ; Recorder:   Renato Gails, RN, Jonelle Sports; Confirmation:   Confirmed ; Classification:   Patient Stated ; Code:   [id number] ; Contributor System:   PowerChart ; Last Updated:   08/28/2020 15:34 EDT ; Life Cycle Date:   08/28/2020 ; Life Cycle Status:   Active ; Vocabulary:   SNOMED CT        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 )  Name of Problem:   Hypochondriacal disorder ; Recorder:   Renato Gails, RN, Jonelle Sports; Confirmation:   Confirmed ; Classification:   Patient Stated ; Code:   [id number] ; Contributor System:   Dietitian ; Last Updated:   08/28/2020 15:34 EDT ; Life Cycle Date:   08/28/2020 ; Life Cycle Status:   Active ; Vocabulary:   SNOMED CT          Diagnoses(Active)    Anxiety  Date:   08/28/2020 ; Diagnosis Type:   Reason For Visit ; Confirmation:   Complaint of ; Clinical Dx:   Anxiety ; Classification:   Medical ; Clinical Service:   Non-Specified ; Code:   PNED ; Probability:   0 ; Diagnosis Code:   [id number]             -    Procedure History   (As Of: 08/28/2020 15:36:36 EDT)     Phoebe Perch Fall Risk Assessment Tool   Hx of falling last 3 months ED Fall :   No   Patient confused or disoriented ED Fall :   No   Patient intoxicated or sedated ED Fall :   No   Patient impaired gait ED Fall :   No   Use a mobility assistance device ED Fall  :   No   Patient altered elimination ED Fall :   No   [facility] ED Fall Score :   0    Cletis Athens - 08/28/2020 15:35 EDT   ED Advance Directive   Advance Directive :   No   Renato Gails RN, Jonelle Sports - 08/28/2020 15:35 EDT   Social History   Social History   (As Of: 08/28/2020 15:36:36 EDT)   Tobacco:        Tobacco use: Never (less than 100 in lifetime).   (Last Updated: 08/28/2020 15:35:30 EDT by Renato Gails RN, Jonelle Sports)          Electronic Cigarette/Vaping:        Use, within last 90 days Electronic Cigarette Use.  Nicotine infused Electronic Cigarette Type.  26-50 inhales/day Use  Per Day.   (Last Updated: 08/28/2020 15:35:51 EDT by Renato Gails, RN, Jonelle Sports)          Alcohol:        Current, 1-2 times per week   (Last Updated: 08/28/2020 15:36:02 EDT by Renato Gails RN, Jonelle Sports)          Substance Use:        Current, 1-2 times per month, Marijuana   (Last Updated: 08/28/2020 15:36:22 EDT by Renato Gails RN, Jonelle Sports)
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
## 2020-08-28 NOTE — ED Notes (Signed)
ED Patient Education Note     Patient Education Materials Follows:  Mental and Behavioral Health     Panic Attack    A panic attack is when you suddenly feel very afraid, uncomfortable, or nervous (anxious). A panic attack can happen when you are scared or for no reason.    A panic attack can feel like a serious problem. It can even feel like a heart attack or stroke. See your doctor when you have a panic attack to make sure you do not have a serious problem.      Follow these instructions at home:       Take medicines only as told by your doctor.     If you feel worried or nervous, try not to have caffeine.     Take good care of your health. To do this:  ? Eat healthy. Make sure to eat fresh fruits and vegetables, whole grains, lean meats, and low-fat dairy.    ? Get enough sleep. Try to sleep for 7?8 hours each night.    ? Exercise. Try to be active for 30 minutes 5 or more days a week.    ? Do not smoke. Talk to your doctor if you need help quitting.    ? Limit how much alcohol you drink:  ? If you are a woman who is not pregnant: try not to have more than 1 drink a day.    ? If you are a man: try not to have more than 2 drinks a day.    ? One drink equals 12 oz of beer, 5 oz of wine, or 1? oz of hard liquor.         Keep all follow-up visits as told by your doctor. This is important.      Contact a doctor if:     Your symptoms do not get better.     Your symptoms get worse.     You are not able to take your medicines as told.      Get help right away if:     You have thoughts of hurting yourself or others.     You have symptoms of a panic attack. Do not drive yourself to the hospital. Have someone else drive you or call an ambulance.    If you feel like you may hurt yourself or others, or have thoughts about taking your own life, get help right away. You can go to your nearest emergency department or call:   Your local emergency services (911 in the [place]     A suicide crisis helpline, such as the National  Suicide Prevention Lifeline at 915-189-4743. This is open 24 hours a day.        Summary     A panic attack is when you suddenly feel very afraid, uncomfortable, or nervous (anxious).     See your doctor when you have a panic attack to make sure that you do not have another serious problem.     If you feel like you may hurt yourself or others, get help right away by calling 911.      This information is not intended to replace advice given to you by your health care provider. Make sure you discuss any questions you have with your health care provider.      Document Revised: 07/23/2019 Document Reviewed: 07/23/2019  Elsevier Patient Education ? 2021 Elsevier Inc.

## 2020-08-28 NOTE — ED Notes (Signed)
 ED Patient Summary       ;       [facility]  [address], [place] [address]  [phone]  Discharge Instructions (Patient)  Name: Julie Reynolds, Julie Reynolds  DOB:  24-Aug-1995                   MRN: [id number]                   FIN: [phone]%[phone]  Reason For Visit: Shortness of breath; Anxiety; DIFF BREATHING,TIGHTNESS IN CHEST  Final Diagnosis: Acute anxiety; Pleurisy     Visit Date: 08/28/2020 15:22:00  Address: [address] [place] [address]  Phone: [phone]     Emergency Department Providers:         Primary Physician:      ARVELL FEATHER SUESS      [facility] would like to thank you for allowing us  to assist you with your healthcare needs. The following includes patient education materials and information regarding your injury/illness.     Follow-up Instructions:  You were seen today on an emergency basis. Please contact your primary care doctor for a follow up appointment. If you received a referral to a specialist doctor, it is important you follow-up as instructed.    It is important that you call your follow-up doctor to schedule and confirm the location of your next appointment. Your doctor may practice at multiple locations. The office location of your follow-up appointment may be different to the one written on your discharge instructions.    If you do not have a primary care doctor, please call [phone] for help in finding a [facility] Provider. For help in finding a specialist doctor, please call [phone].    If your condition gets worse before your follow-up with your primary care doctor or specialist, please return to the Emergency Department.      Coronavirus 2019 (COVID-19) Reminders:     Patients age 36 - 50, with parental consent, and patients over age 65 can make an appointment for a COVID-19 vaccine. Patients can contact their [facility] doctors' offices to schedule an  appointment to receive the COVID-19 vaccine. Patients who do not have a [facility] physician can call [phone] to schedule vaccination appointments.      Follow Up Appointments:  Primary Care Provider:     Name: PCP,  NONE     Phone:                   With: Address: When:   Primary care physician - [facility]   [phone] Within 1 week   Comments:   Call [phone] to find a primary care physician.              Post [facility] SERVICES%>             New Medications  Printed Prescriptions  albuterol (albuterol CFC free 90 mcg/inh inhalation aerosol) 2 Puffs Inhale (breathe in) every 4 hours as needed shortness of breath/wheezing. may administer 1 or 2 puffs every 4 -6 hours as needed. Refills: 0.  Last Dose:____________________  ibuprofen (ibuprofen 400 mg oral tablet) 1 Tabs Oral (given by mouth) 3 times a day for 7 Days. Refills: 0.  Last Dose:____________________      Allergy Info: Keflex  Discharge Additional Information  Discharge Patient 08/28/20 16:26:00 EDT      Patient Education Materials:        Panic Attack    A panic attack is when you suddenly feel very afraid, uncomfortable, or nervous (anxious). A panic attack can happen when you are scared or for no reason.    A panic attack can feel like a serious problem. It can even feel like a heart attack or stroke. See your doctor when you have a panic attack to make sure you do not have a serious problem.      Follow these instructions at home:       Take medicines only as told by your doctor.     If you feel worried or nervous, try not to have caffeine.     Take good care of your health. To do this:  ? Eat healthy. Make sure to eat fresh fruits and vegetables, whole grains, lean meats, and low-fat dairy.    ? Get enough sleep. Try to sleep for 7?8 hours each night.    ? Exercise. Try to be active for 30 minutes 5 or more days a week.    ? Do not smoke. Talk to your doctor if  you need help quitting.    ? Limit how much alcohol you drink:  ? If you are a woman who is not pregnant: try not to have more than 1 drink a day.    ? If you are a man: try not to have more than 2 drinks a day.    ? One drink equals 12 oz of beer, 5 oz of wine, or 1? oz of hard liquor.         Keep all follow-up visits as told by your doctor. This is important.      Contact a doctor if:     Your symptoms do not get better.     Your symptoms get worse.     You are not able to take your medicines as told.      Get help right away if:     You have thoughts of hurting yourself or others.     You have symptoms of a panic attack. Do not drive yourself to the hospital. Have someone else drive you or call an ambulance.    If you feel like you may hurt yourself or others, or have thoughts about taking your own life, get help right away. You can go to your nearest emergency department or call:   Your local emergency services (911 in the [place]     A suicide crisis helpline, such as the National Suicide Prevention Lifeline at 3148666074. This is open 24 hours a day.        Summary     A panic attack is when you suddenly feel very afraid, uncomfortable, or nervous (anxious).     See your doctor when you have a panic attack to make sure that you do not have another serious problem.     If you feel like you may hurt yourself or others, get help right away by calling 911.      This information is not intended to replace advice given to you by your health care provider. Make sure you discuss any questions you have with your health care provider.      Document Revised: 07/23/2019 Document Reviewed: 07/23/2019  Elsevier Patient Education ? 2021 Elsevier Inc.      ---------------------------------------------------------------------------------------------------------------------  [facility] allows patients to  review your COVID and other test results as well as discharge documents from any [facility] Emergency Department, surgical center or outpatient lab. Test results are typically available 36 hours after the test is completed.     [facility] encourages you to self-enroll in the [facility] Patient Portal.     To begin your self-enrollment process, please visit [email or web address] Under [facility], click on "Sign up now".     NOTE: You must be 16 years and older to use [facility] Self-Enroll online. If you are a parent, caregiver, or guardian; you need an invite to access your child's or dependent's health records. To obtain an invite, contact the Medical Records department at [phone] Monday through Friday, 8-4:30, select option 3 . If we receive your call afterhours, we will return your call the next business day.     If you have issues trying to create or access your account, contact [employer] support at [phone] available 7 days a week 24 hours a day.     Comment:

## 2020-08-28 NOTE — ED Notes (Signed)
ED Triage Note       ED Triage Adult Entered On:  08/28/2020 15:35 EDT    Performed On:  08/28/2020 15:31 EDT by Renato Gails, RN, Jonelle Sports               Triage   Numeric Rating Pain Scale :   0 = No pain   Chief Complaint :   Patient C/C of anxiety attack.  Pt states she smoke some pot x 3 days ago and hasnt felt right since.  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   [place] Mode of Arrival :   Private vehicle   Infectious Disease Documentation :   Document assessment   Temperature Oral :   37.4 degC(Converted to: 99.3 degF)  (HI)    Heart Rate Monitored :   90 bpm   Respiratory Rate :   20 br/min   Systolic Blood Pressure :   148 mmHg (HI)    Diastolic Blood Pressure :   100 mmHg (HI)    SpO2 :   98 %   Oxygen Therapy :   Room air   Patient presentation :   None of the above   Chief Complaint or Presentation suggest infection :   No   Dosing Weight Obtained By :   Measured   Weight Dosing :   117.6 kg(Converted to: 259 lb 4 oz)    Height :   172 cm(Converted to: 5 ft 8 in)    Body Mass Index Dosing :   40 kg/m2   Cletis Athens - 08/28/2020 15:31 EDT   DCP GENERIC CODE   Tracking Acuity :   3   Tracking Group :   ED [facility]   Renato Gails, Hotel manager - 08/28/2020 15:31 EDT   ED General Section :   Document assessment   Pregnancy Status :   Patient denies   ED Allergies Section :   Document assessment   ED Reason for Visit Section :   Document assessment   ED Home Meds Section :   Document assessment   ED Quick Assessment :   Patient appears awake, alert, oriented to baseline. Skin warm and dry. Moves all extremities. Respiration even and unlabored. Appears in no apparent distress.   Renato Gails, Charity fundraiser, Jonelle Sports - 08/28/2020 15:31 EDT   ID Risk Screen Symptoms   Recent Travel History :   No recent travel   TB Symptom Screen :   No symptoms   Last 90 days COVID-19 ID :   No   Close Contact with COVID-19 ID :   No   Last 14 days COVID-19 ID :   No   Cletis Athens - 08/28/2020  15:31 EDT   Allergies   (As Of: 08/28/2020 15:35:01 EDT)   Allergies (Active)   Keflex  Estimated Onset Date:   Unspecified ; Reactions:   Rash ; Created By:   Renato Gails RN, Jonelle Sports; Reaction Status:   Active ; Category:   Drug ; Substance:   Keflex ; Type:   Allergy ; Severity:   Mild ; Updated By:   Renato Gails RN, Jonelle Sports; Reviewed Date:   08/28/2020 15:34 EDT        Psycho-Social   Last 3 mo, thoughts killing self/others :   Patient denies   Right click within box for Suspected Abuse policy link. :   None   Feels Safe  Where Live :   Yes   ED Behavioral Activity Rating Scale :   4 - Quiet and awake (normal level of activity)   Renato Gails, Hotel manager - 08/28/2020 15:31 EDT   ED Home Med List   Medication List   (As Of: 08/28/2020 15:35:01 EDT)   No Known Home Medications     Cletis Athens 08/28/2020 15:34:58           ED Reason for Visit   (As Of: 08/28/2020 15:35:01 EDT)   Problems(Active)    ADHD (SNOMED CT  :1761607371 )  Name of Problem:   ADHD ; Recorder:   Renato Gails, RN, Jonelle Sports; Confirmation:   Confirmed ; Classification:   Patient Stated ; Code:   [id number] ; Contributor System:   PowerChart ; Last Updated:   08/28/2020 15:34 EDT ; Life Cycle Date:   08/28/2020 ; Life Cycle Status:   Active ; Vocabulary:   SNOMED CT        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 )  Name of Problem:   Hypochondriacal disorder ; Recorder:   Renato Gails, RN, Jonelle Sports; Confirmation:   Confirmed ; Classification:   Patient Stated ; Code:   [id number] ; Contributor System:   Dietitian ; Last Updated:   08/28/2020 15:34 EDT ; Life Cycle Date:   08/28/2020 ; Life Cycle Status:   Active ; Vocabulary:   SNOMED CT          Diagnoses(Active)    Anxiety  Date:   08/28/2020 ; Diagnosis Type:   Reason For Visit ; Confirmation:   Complaint of ; Clinical Dx:   Anxiety ; Classification:   Medical ; Clinical Service:   Non-Specified ; Code:   PNED ; Probability:   0 ; Diagnosis Code:   [id number]
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
## 2020-09-18 NOTE — Discharge Summary (Signed)
 ED Clinical Summary                      [facility]  [address]  [place], [place], [address]  [phone]           PERSON INFORMATION  Name: Julie Reynolds, Julie Reynolds Age:  25 Years DOB: March 31, 1995   Sex: Female Language: English PCP: PCP,  NONE   Marital Status:  Single Phone: [phone] Med Service: JULINE Jeral Cellar   MRN:  [id number] Acct# [id number] Arrival: 09/18/2020 13:39:00   Visit Reason: Rectal pain; Anxiety; ANXIETY Acuity: 4 LOS: 000 02:47   Address:      [address] [place] [address]  Diagnosis:      Anal or rectal pain; Anxiety  Printed Prescriptions:            Allergies      Keflex (Rash)      Medications Administered During Visit:        Patient Medication List:              albuterol (albuterol CFC free 90 mcg/inh inhalation aerosol) 2 Puffs Inhale (breathe in) every 4 hours as needed shortness of breath/wheezing. may administer 1 or 2 puffs every 4 -6 hours as needed. Refills: 0.       Major Tests and Procedures:  The following procedures and tests were performed during your ED visit.  COMMONPROCEDURES%>  COMMON PROCEDURESCOMMENTS%>          Laboratory Orders  No laboratory orders were placed.              Radiology Orders  No radiology orders were placed.              Patient Care Orders  Name Status Details   Discharge Patient Ordered 09/18/20 16:17:00 EDT   ED Assessment Adult Completed 09/18/20 14:05:33 EDT, 09/18/20 14:05:33 EDT   ED Secondary Triage Completed 09/18/20 14:05:33 EDT, 09/18/20 14:05:33 EDT   ED Triage Adult Completed 09/18/20 13:39:41 EDT, 09/18/20 13:39:41 EDT           PROVIDER INFORMATION              Provider Role Assigned Sampson Rase, RN, Lamar BRAVO ED Nurse 09/18/2020 13:45:52    DOMINICK LAMAR M-MD ED Provider 09/18/2020 14:18:24        Attending Physician:  DOMINICK LAMAR M-MD    Admit Doc  DOMINICK LAMAR M-MD   Consulting Doc       VITALS INFORMATION  Vital Sign Triage Latest   Temp Oral ORAL_1%>37 degC  ORAL%>37 degC   Temp Temporal TEMPORAL_1%> TEMPORAL%>   Temp Intravascular INTRAVASCULAR_1%> INTRAVASCULAR%>   Temp Axillary AXILLARY_1%> AXILLARY%>   Temp Rectal RECTAL_1%> RECTAL%>   02 Sat 97 % 97 %   Respiratory Rate RATE_1%>12 br/min RATE%>12 br/min   Peripheral Pulse Rate PULSE RATE_1%> PULSE RATE%>   Apical Heart Rate HEART RATE_1%> HEART RATE%>   Blood Pressure BLOOD PRESSURE_1%>/ BLOOD PRESSURE_1%>92 mmHg BLOOD PRESSURE%>151 mmHg / BLOOD PRESSURE%>92 mmHg              Immunizations      No Immunizations Documented This Visit       DISCHARGE INFORMATION   Discharge Disposition: H Outpt-Sent Home   Discharge Location:    Home   Discharge Date and Time:    09/18/2020 16:26:55   ED Checkout Date and Time:    09/18/2020  16:26:55     DEPART REASON INCOMPLETE INFORMATION               Depart Action Incomplete Reason   Interactive View/I&O Recently assessed               Problems      No Problems Documented              Smoking Status      Never (less than 100 in lifetime)         PATIENT EDUCATION INFORMATION  Instructions:       [facility] (CUSTOM); Anal Fissure, Adult     Follow up:                  With: Address: When:   Follow up with primary care provider  Within 3 to 5 days, only if needed   Comments:   Follow up with your MD if not improved.   Return to ED if symptoms worsen           ED PROVIDER DOCUMENTATION     Patient:   Julie Reynolds, Julie Reynolds            MRN: [id number]            FIN: [id number]               Age:   25 years     Sex:  Female     DOB:  [birth year]   Associated Diagnoses:   Anal or rectal pain; Anxiety   Author:   DOMINICK CHARLESTON M-MD      Basic Information   Additional information: Chief Complaint from Nursing Triage Note   Chief Complaint  Chief Complaint: Patient C/C of anxiety attack.  Pt states I was about to have anal sex with my fiance, when he noticed what is possibly a hemorrhoid,  I became very afraid that I might have an STD and now Im freaking out and cannot calm  down. (09/18/20 14:01:00).      History of Present Illness   The patient presents with rectal pain.  The onset was 1  days ago.  The course/duration of symptoms is constant.  The character of symptoms is dull.  The degree of symptoms is minimal.  The exacerbating factor is none.  The relieving factor is none.  Risk factors consist of none.  Prior episodes: chronic.  Therapy today: none.  Associated symptoms: none.  Pt c/o bump on her anus for a while, i.e. years. It became more painful today after anal intercourse. Pt is very anxious and fears it may be cancer.   She denies bleeding or d/c.SABRA        Review of Systems   Constitutional symptoms:  Negative except as documented in HPI.   Skin symptoms:  Negative except as documented in HPI.   Gastrointestinal symptoms:  Negative except as documented in HPI.   Genitourinary symptoms:  Negative except as documented in HPI.   Musculoskeletal symptoms:  Negative except as documented in HPI.   Neurologic symptoms:  Negative except as documented in HPI.      Health Status   Allergies:    Allergic Reactions (Selected)  Mild  Keflex- Rash..   Medications:  (Selected)   Prescriptions  Prescribed  albuterol CFC free 90 mcg/inh inhalation aerosol: 2 puffs, Inhale, q4hr, may administer 1 or 2 puffs every 4 -6 hours as needed, PRN: shortness of breath/wheezing, 1 EA, 0 Refill(s).  Past Medical/ Family/ Social History   Problem list:    Active Problems (4)  ADHD   Anxiety   Hypochondriacal disorder   Panic attacks   , per nurse's notes.      Physical Examination               Vital Signs   Vital Signs   09/18/2020 14:01 EDT Systolic Blood Pressure 151 mmHg  HI    Diastolic Blood Pressure 92 mmHg  HI    Temperature Oral 37 degC    Heart Rate Monitored 87 bpm    Respiratory Rate 12 br/min    SpO2 97 %   .   Measurements   09/18/2020 14:05 EDT Body Mass Index est meas 37.08 kg/m2   09/18/2020 14:05 EDT Body Mass Index Measured 37.08 kg/m2   09/18/2020 14:01 EDT Height/Length  Measured 172 cm    Weight Dosing 109.7 kg   .   Basic Oxygen Information   09/18/2020 14:01 EDT Oxygen Therapy Room air    SpO2 97 %   .   General:  Alert, no acute distress, anxious.    Skin:  Warm, dry.    Head:  Normocephalic, atraumatic.    Cardiovascular:  Normal peripheral perfusion.   Respiratory:  Respirations are non-labored.   Gastrointestinal:  Rectal exam: Normal tone, mass small firm nodule at 6 o'clock position. No erythema. bleeding or d/c, tenderness.   Back:  Normal range of motion.   Musculoskeletal:  Normal ROM.   Neurological:  No focal neurological deficit observed.   Psychiatric:  Cooperative, Mood and affect: Anxious, tearful.       Medical Decision Making   Differential Diagnosis:  Rectal pain, external hemorrhoid, peri-rectal abscess, anal fissure, rectal foreign body.       Reexamination/ Reevaluation   Vital signs   Basic Oxygen Information   09/18/2020 14:01 EDT Oxygen Therapy Room air    SpO2 97 %         Impression and Plan   Diagnosis   Anal or rectal pain (ICD10-CM K62.89, Discharge, Medical)   Anxiety (ICD10-CM F41.9, Discharge, Medical)   Plan   Condition: Stable.    Disposition: Discharged: Time  09/18/2020 16:16:00, to home.    Patient was given the following educational materials: Anal Fissure, Adult, [employer] (CUSTOM).    Follow up with: Follow up with primary care provider Within 3 to 5 days, only if needed Follow up with your MD if not improved.  Return to ED if symptoms worsen.    Counseled: Patient, Friend, Regarding diagnosis, Regarding treatment plan, Patient indicated understanding of instructions.

## 2020-09-18 NOTE — ED Notes (Signed)
ED Patient Education Note     ;Patient Education Materials Follows:           [facility]      Follow up at [facility]. [facility]    [address]  [address]  [place], [place] [address]    OR    [address]  [place]. [place], [place]  [address]      Call [phone] for appt/location Mon-Fri 9a-4p. All other times call and leave a message with the answering service. The staff will contact you to set up your appointment.      Gastroenterology     Anal Fissure, Adult      An anal fissure is a small tear or crack in the tissue of the anus. Bleeding from a fissure usually stops on its own within a few minutes. However, bleeding will often occur again with each bowel movement until the fissure heals.      What are the causes?    This condition is usually caused by passing a large or hard stool (feces). Other causes include:   Constipation.     Frequent diarrhea.     Inflammatory bowel disease (Crohn's disease or ulcerative colitis).     Childbirth.     Infections.     Anal sex.        What are the signs or symptoms?    Symptoms of this condition include:   Bleeding from the rectum.     Small amounts of blood seen on your stool, on the toilet paper, or in the toilet after a bowel movement. The blood coats the outside of the stool and is not mixed with the stool.     Painful bowel movements.     Itching or irritation around the anus.        How is this diagnosed?    A health care provider may diagnose this condition by closely examining the anal area. An anal fissure can usually be seen with careful inspection. In some cases, a rectal exam may be performed, or a short tube (anoscope) may be used to examine the anal canal.      How is this treated?    Initial treatment for this condition may include:   Taking steps to avoid constipation. This may include making changes to your diet, such as increasing your intake of fiber or fluid.     Taking fiber supplements. These supplements can soften your  stool to help make bowel movements easier. Your health care provider may also prescribe a stool softener if your stool is hard.     Taking sitz baths. This may help to heal the tear.     Using medicated creams or ointments. These may be prescribed to lessen discomfort.      Treatments that are sometimes used if initial treatments do not work well or if the condition is more severe may include:   Botulinum injection.     Surgery to repair the fissure.        Follow these instructions at home:    Eating and drinking       Avoid foods that may cause constipation, such as bananas, milk, and other dairy products.     Eat all fruits, except bananas.     Drink enough fluid to keep your urine pale yellow.     Eat foods that are high in fiber, such as beans, whole grains, and fresh fruits and vegetables.  General instructions       Take over-the-counter and prescription medicines only as told by your health care provider.     Use creams or ointments only as told by your health care provider.     Keep the anal area clean and dry.     Take sitz baths as told by your health care provider. Do not use soap in the sitz baths.     Keep all follow-up visits as told by your health care provider. This is important.        Contact a health care provider if you have:     More bleeding.     A fever.     Diarrhea that is mixed with blood.     Pain that continues.     Ongoing problems that are getting worse rather than better.      Summary     An anal fissure is a small tear or crack in the tissue of the anus. This condition is usually caused by passing a large or hard stool (feces). Other causes include constipation and frequent diarrhea.     Initial treatment for this condition may include taking steps to avoid constipation, such as increasing your intake of fiber or fluid.     Follow instructions for care as told by your health care provider.     Contact your health care provider if you have more bleeding or your problem is getting  worse rather than better.     Keep all follow-up visits as told by your health care provider. This is important.      This information is not intended to replace advice given to you by your health care provider. Make sure you discuss any questions you have with your health care provider.      Document Revised: 07/04/2017 Document Reviewed: 07/04/2017  Elsevier Patient Education ? 2021 Elsevier Inc[employer]

## 2020-09-18 NOTE — ED Notes (Signed)
 ED Triage Note       ED Triage Adult Entered On:  09/18/2020 14:05 EDT    Performed On:  09/18/2020 14:01 EDT by Cloria, RN, Lamar BRAVO               Triage   Numeric Rating Pain Scale :   0 = No pain   Chief Complaint :   Patient C/C of anxiety attack.  Pt states I was about to have anal sex with my fiance, when he noticed what is possibly a hemorrhoid,  I became very afraid that I might have an STD and now Im freaking out and cannot calm down.   [place] Mode of Arrival :   Private vehicle   Infectious Disease Documentation :   Document assessment   Temperature Oral :   37 degC(Converted to: 98.6 degF)    Heart Rate Monitored :   87 bpm   Respiratory Rate :   12 br/min   Systolic Blood Pressure :   151 mmHg (HI)    Diastolic Blood Pressure :   92 mmHg (HI)    SpO2 :   97 %   Oxygen Therapy :   Room air   Patient presentation :   None of the above   Chief Complaint or Presentation suggest infection :   No   Dosing Weight Obtained By :   Measured   Weight Dosing :   109.7 kg(Converted to: 241 lb 14 oz)    Height :   172 cm(Converted to: 5 ft 8 in)    Body Mass Index Dosing :   37 kg/m2   Cloria OBIE Lamar BRAVO - 09/18/2020 14:01 EDT   DCP GENERIC CODE   Tracking Acuity :   4   Tracking Group :   [facility] [place] [employer]   Cloria, Hotel manager - 09/18/2020 14:01 EDT   ED General Section :   Document assessment   Pregnancy Status :   Patient denies   ED Allergies Section :   Document assessment   ED Reason for Visit Section :   Document assessment   ED Home Meds Section :   Document assessment   ED Quick Assessment :   Patient appears awake, alert, oriented to baseline. Skin warm and dry. Moves all extremities. Respiration even and unlabored. Appears in no apparent distress.   Cloria, Charity fundraiser, Lamar BRAVO - 09/18/2020 14:01 EDT   ID Risk Screen Symptoms   Recent Travel History :   No recent travel   TB Symptom Screen :   No symptoms   Last 90 days COVID-19 ID :   No   Close Contact with COVID-19 ID :   No   Last 14 days COVID-19 ID :    No   Cloria OBIE Lamar BRAVO - 09/18/2020 14:01 EDT   Allergies   (As Of: 09/18/2020 14:05:32 EDT)   Allergies (Active)   Keflex  Estimated Onset Date:   Unspecified ; Reactions:   Rash ; Created By:   Cloria RN, Lamar BRAVO; Reaction Status:   Active ; Category:   Drug ; Substance:   Keflex ; Type:   Allergy ; Severity:   Mild ; Updated By:   Cloria OBIE Lamar BRAVO; Reviewed Date:   09/18/2020 14:04 EDT        Psycho-Social   Last 3 mo, thoughts killing self/others :   Patient denies   Right click within box for Suspected Abuse policy link. :  None   Feels Safe Where Live :   Yes   ED Behavioral Activity Rating Scale :   4 - Quiet and awake (normal level of activity)   Cloria, Charity fundraiser, Lamar BRAVO - 09/18/2020 14:01 EDT   ED Home Med List   Medication List   (As Of: 09/18/2020 14:05:32 EDT)   Prescription/Discharge Order    albuterol  :   albuterol ; Status:   Prescribed ; Ordered As Mnemonic:   albuterol CFC free 90 mcg/inh inhalation aerosol ; Simple Display Line:   2 puffs, Inhale, q4hr, may administer 1 or 2 puffs every 4 -6 hours as needed, PRN: shortness of breath/wheezing, 1 EA, 0 Refill(s) ; Ordering Provider:   ARVELL FEATHER SUESS-MD; Catalog Code:   albuterol ; Order Dt/Tm:   08/28/2020 16:25:51 EDT            ED Reason for Visit   (As Of: 09/18/2020 14:05:32 EDT)   Problems(Active)    ADHD (SNOMED CT  :7841841983 )  Name of Problem:   ADHD ; Recorder:   Cloria, RN, Lamar BRAVO; Confirmation:   Confirmed ; Classification:   Patient Stated ; Code:   7841841983 ; Contributor System:   PowerChart ; Last Updated:   08/28/2020 15:34 EDT ; Life Cycle Date:   08/28/2020 ; Life Cycle Status:   Active ; Vocabulary:   SNOMED CT        Anxiety (SNOMED CT  :18866980 )  Name of Problem:   Anxiety ; Recorder:   Cloria, RN, Lamar BRAVO; Confirmation:   Confirmed ; Classification:   Patient Stated ; Code:   [id number] ; Contributor System:   PowerChart ; Last Updated:   09/18/2020 14:05 EDT ; Life Cycle Date:   09/18/2020 ; Life Cycle Status:   Active ;  Vocabulary:   SNOMED CT        Hypochondriacal disorder (SNOMED CT  :69270987 )  Name of Problem:   Hypochondriacal disorder ; Recorder:   Cloria, RN, Lamar BRAVO; Confirmation:   Confirmed ; Classification:   Patient Stated ; Code:   69270987 ; Contributor System:   PowerChart ; Last Updated:   08/28/2020 15:34 EDT ; Life Cycle Date:   08/28/2020 ; Life Cycle Status:   Active ; Vocabulary:   SNOMED CT        Panic attacks (SNOMED CT  :660955986 )  Name of Problem:   Panic attacks ; Recorder:   Cloria, RN, Lamar BRAVO; Confirmation:   Confirmed ; Classification:   Patient Stated ; Code:   660955986 ; Contributor System:   PowerChart ; Last Updated:   09/18/2020 14:05 EDT ; Life Cycle Date:   09/18/2020 ; Life Cycle Status:   Active ; Vocabulary:   SNOMED CT          Diagnoses(Active)    Anxiety  Date:   09/18/2020 ; Diagnosis Type:   Reason For Visit ; Confirmation:   Complaint of ; Clinical Dx:   Anxiety ; Classification:   Medical ; Clinical Service:   Non-Specified ; Code:   PNED ; Probability:   0 ; Diagnosis Code:   [id number]

## 2020-09-18 NOTE — ED Provider Notes (Signed)
Rectal pain        Patient:   Julie Reynolds, Julie Reynolds            MRN: [id number]            FIN: [id number]               Age:   25 years     Sex:  Female     DOB:  11/03/1995   Associated Diagnoses:   Anal or rectal pain; Anxiety   Author:   Orpah Greek M-MD      Basic Information   Additional information: Chief Complaint from Nursing Triage Note   Chief Complaint  Chief Complaint: Patient C/C of anxiety attack.  Pt states "I was about to have anal sex with my fiance, when he noticed what is possibly a hemorrhoid,  I became very afraid that I might have an STD and now Im freaking out and cannot calm down." (09/18/20 14:01:00).      History of Present Illness   The patient presents with rectal pain.  The onset was 1  days ago.  The course/duration of symptoms is constant.  The character of symptoms is dull.  The degree of symptoms is minimal.  The exacerbating factor is none.  The relieving factor is none.  Risk factors consist of none.  Prior episodes: chronic.  Therapy today: none.  Associated symptoms: none.  Pt c/o "bump" on her anus "for a while", i.e. years. It became more painful today after anal intercourse. Pt is very anxious and fears it may be cancer.   She denies bleeding or d/c.Marland Kitchen        Review of Systems   Constitutional symptoms:  Negative except as documented in HPI.   Skin symptoms:  Negative except as documented in HPI.   Gastrointestinal symptoms:  Negative except as documented in HPI.   Genitourinary symptoms:  Negative except as documented in HPI.   Musculoskeletal symptoms:  Negative except as documented in HPI.   Neurologic symptoms:  Negative except as documented in HPI.      Health Status   Allergies:    Allergic Reactions (Selected)  Mild  Keflex- Rash..   Medications:  (Selected)   Prescriptions  Prescribed  albuterol CFC free 90 mcg/inh inhalation aerosol: 2 puffs, Inhale, q4hr, may administer 1 or 2 puffs every 4 -6 hours as needed, PRN: shortness of breath/wheezing, 1 EA, 0 Refill(s).       Past Medical/ Family/ Social History   Problem list:    Active Problems (4)  ADHD   Anxiety   Hypochondriacal disorder   Panic attacks   , per nurse's notes.      Physical Examination               Vital Signs   Vital Signs   09/18/2020 14:01 EDT Systolic Blood Pressure 151 mmHg  HI    Diastolic Blood Pressure 92 mmHg  HI    Temperature Oral 37 degC    Heart Rate Monitored 87 bpm    Respiratory Rate 12 br/min    SpO2 97 %   .   Measurements   09/18/2020 14:05 EDT Body Mass Index est meas 37.08 kg/m2   09/18/2020 14:05 EDT Body Mass Index Measured 37.08 kg/m2   09/18/2020 14:01 EDT Height/Length Measured 172 cm    Weight Dosing 109.7 kg   .   Basic Oxygen Information   09/18/2020 14:01 EDT Oxygen Therapy Room air  SpO2 97 %   .   General:  Alert, no acute distress, anxious.    Skin:  Warm, dry.    Head:  Normocephalic, atraumatic.    Cardiovascular:  Normal peripheral perfusion.   Respiratory:  Respirations are non-labored.   Gastrointestinal:  Rectal exam: Normal tone, mass small firm nodule at 6 o'clock position. No erythema. bleeding or d/c, tenderness.   Back:  Normal range of motion.   Musculoskeletal:  Normal ROM.   Neurological:  No focal neurological deficit observed.   Psychiatric:  Cooperative, Mood and affect: Anxious, tearful.       Medical Decision Making   Differential Diagnosis:  Rectal pain, external hemorrhoid, peri-rectal abscess, anal fissure, rectal foreign body.       Reexamination/ Reevaluation   Vital signs   Basic Oxygen Information   09/18/2020 14:01 EDT Oxygen Therapy Room air    SpO2 97 %         Impression and Plan   Diagnosis   Anal or rectal pain (ICD10-CM K62.89, Discharge, Medical)   Anxiety (ICD10-CM F41.9, Discharge, Medical)   Plan   Condition: Stable.    Disposition: Discharged: Time  09/18/2020 16:16:00, to home.    Patient was given the following educational materials: Anal Fissure, Adult, [employer] (CUSTOM).    Follow up with: Follow up with primary care provider  Within 3 to 5 days, only if needed Follow up with your MD if not improved.  Return to ED if symptoms worsen.    Counseled: Patient, Friend, Regarding diagnosis, Regarding treatment plan, Patient indicated understanding of instructions.    Signature Line     Electronically Signed on 09/18/2020 04:17 PM EDT   ________________________________________________   Orpah Greek M-MD

## 2020-09-18 NOTE — ED Notes (Signed)
ED Triage Note       ED Secondary Triage Entered On:  09/18/2020 14:05 EDT    Performed On:  09/18/2020 14:05 EDT by Renato Gails, RN, Jonelle Sports               General Information   Barriers to Learning :   None evident   Languages :   English   Immunizations Current :   Yes   ED Home Meds Section :   Document assessment   [facility] ED Fall Risk Section :   Document assessment   ED History Section :   Document assessment   ED Advance Directives Section :   Document assessment   ED Palliative Screen :   N/A (prefilled for <65yo)   Julie Reynolds - 09/18/2020 14:05 EDT   (As Of: 09/18/2020 14:05:56 EDT)   Problems(Active)    ADHD (SNOMED CT  :8299371696 )  Name of Problem:   ADHD ; Recorder:   Renato Gails, RN, Jonelle Sports; Confirmation:   Confirmed ; Classification:   Patient Stated ; Code:   [id number] ; Contributor System:   PowerChart ; Last Updated:   08/28/2020 15:34 EDT ; Life Cycle Date:   08/28/2020 ; Life Cycle Status:   Active ; Vocabulary:   SNOMED CT        Anxiety (SNOMED CT  :10258527 )  Name of Problem:   Anxiety ; Recorder:   Renato Gails, RN, Jonelle Sports; Confirmation:   Confirmed ; Classification:   Patient Stated ; Code:   [id number] ; Contributor System:   PowerChart ; Last Updated:   09/18/2020 14:05 EDT ; Life Cycle Date:   09/18/2020 ; Life Cycle Status:   Active ; Vocabulary:   SNOMED CT        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 )  Name of Problem:   Hypochondriacal disorder ; Recorder:   Renato Gails, RN, Jonelle Sports; Confirmation:   Confirmed ; Classification:   Patient Stated ; Code:   00867619 ; Contributor System:   Dietitian ; Last Updated:   08/28/2020 15:34 EDT ; Life Cycle Date:   08/28/2020 ; Life Cycle Status:   Active ; Vocabulary:   SNOMED CT        Panic attacks (SNOMED CT  :509326712 )  Name of Problem:   Panic attacks ; Recorder:   Renato Gails, RN, Jonelle Sports; Confirmation:   Confirmed ; Classification:   Patient Stated ; Code:   [id number] ; Contributor System:   Dietitian ; Last Updated:   09/18/2020 14:05 EDT ; Life Cycle  Date:   09/18/2020 ; Life Cycle Status:   Active ; Vocabulary:   SNOMED CT          Diagnoses(Active)    Anxiety  Date:   09/18/2020 ; Diagnosis Type:   Reason For Visit ; Confirmation:   Complaint of ; Clinical Dx:   Anxiety ; Classification:   Medical ; Clinical Service:   Non-Specified ; Code:   PNED ; Probability:   0 ; Diagnosis Code:   [id number]             -    Procedure History   (As Of: 09/18/2020 14:05:56 EDT)     [facility] Fall Risk Assessment Tool   Hx of falling last 3 months ED Fall :   No   Patient confused or disoriented ED Fall :   No   Patient intoxicated or sedated ED Fall :   No   Patient impaired  gait ED Fall :   No   Use a mobility assistance device ED Fall :   No   Patient altered elimination ED Fall :   No   [facility] ED Fall Score :   0    Julie Reynolds - 09/18/2020 14:05 EDT   ED Advance Directive   Advance Directive :   No   Renato Gails RN, Jonelle Sports - 09/18/2020 14:05 EDT   Social History   Social History   (As Of: 09/18/2020 14:05:56 EDT)   Tobacco:        Tobacco use: Never (less than 100 in lifetime).   (Last Updated: 08/28/2020 15:35:30 EDT by Renato Gails RN, Jonelle Sports)          Electronic Cigarette/Vaping:        Use, within last 90 days Electronic Cigarette Use.  Nicotine infused Electronic Cigarette Type.  26-50 inhales/day Use Per Day.   (Last Updated: 08/28/2020 15:35:51 EDT by Renato Gails, RN, Jonelle Sports)          Alcohol:        Current, 1-2 times per week   (Last Updated: 08/28/2020 15:36:02 EDT by Renato Gails RN, Jonelle Sports)          Substance Use:        Current, 1-2 times per month, Marijuana   (Last Updated: 08/28/2020 15:36:22 EDT by Renato Gails RN, Jonelle Sports)

## 2020-09-18 NOTE — ED Notes (Signed)
 ED Patient Summary       ;       [facility]  [address] [place] [address]  [phone]  Discharge Instructions (Patient)  Name: Julie Reynolds, Julie Reynolds  DOB:  08-30-1995                   MRN: [id number]                   FIN: [id number]  Reason For Visit: Rectal pain; Anxiety; ANXIETY  Final Diagnosis: Anal or rectal pain; Anxiety     Visit Date: 09/18/2020 13:39:00  Address: [address] [place] [address]  Phone: [phone]     Emergency Department Providers:         Primary Physician:      DOMINICK LAMAR HERO      [facility] would like to thank you for allowing us  to assist you with your healthcare needs. The following includes patient education materials and information regarding your injury/illness.     Follow-up Instructions:  You were seen today on an emergency basis. Please contact your primary care doctor for a follow up appointment. If you received a referral to a specialist doctor, it is important you follow-up as instructed.    It is important that you call your follow-up doctor to schedule and confirm the location of your next appointment. Your doctor may practice at multiple locations. The office location of your follow-up appointment may be different to the one written on your discharge instructions.    If you do not have a primary care doctor, please call [phone] for help in finding a [facility] Provider. For help in finding a specialist doctor, please call [phone].    If your condition gets worse before your follow-up with your primary care doctor or specialist, please return to the Emergency Department.      Coronavirus 2019 (COVID-19) Reminders:     Patients age 24 - 34, with parental consent, and patients over age 71 can make an appointment for a COVID-19 vaccine. Patients can contact their [facility] doctors' offices to schedule an appointment to receive the COVID-19  vaccine. Patients who do not have a [facility] physician can call [phone] to schedule vaccination appointments.      Follow Up Appointments:  Primary Care Provider:     Name: PCP,  NONE     Phone:                   With: Address: When:   Follow up with primary care provider  Within 3 to 5 days, only if needed   Comments:   Follow up with your MD if not improved.   Return to ED if symptoms worsen              Post [facility] SERVICES%>             Medications that have not changed  Other Medications  albuterol (albuterol CFC free 90 mcg/inh inhalation aerosol) 2 Puffs Inhale (breathe in) every 4 hours as needed shortness of breath/wheezing. may administer 1 or 2 puffs every 4 -6 hours as needed. Refills: 0.  Last Dose:____________________      Allergy Info: Keflex  Discharge Additional Information          Discharge Patient 09/18/20 16:17:00 EDT      Patient  Education Materials:              [facility]      Follow up at [facility]    [address]  [address]  [place], [place] [address]    OR    [address]  [place], [place]  [address]      Call [phone] for appt/location Mon-Fri 9a-4p. All other times call and leave a message with the answering service. The staff will contact you to set up your appointment.       Anal Fissure, Adult      An anal fissure is a small tear or crack in the tissue of the anus. Bleeding from a fissure usually stops on its own within a few minutes. However, bleeding will often occur again with each bowel movement until the fissure heals.      What are the causes?    This condition is usually caused by passing a large or hard stool (feces). Other causes include:   Constipation.     Frequent diarrhea.     Inflammatory bowel disease (Crohn's disease or ulcerative colitis).     Childbirth.     Infections.     Anal sex.        What are the signs or symptoms?    Symptoms of this condition include:   Bleeding from the  rectum.     Small amounts of blood seen on your stool, on the toilet paper, or in the toilet after a bowel movement. The blood coats the outside of the stool and is not mixed with the stool.     Painful bowel movements.     Itching or irritation around the anus.        How is this diagnosed?    A health care provider may diagnose this condition by closely examining the anal area. An anal fissure can usually be seen with careful inspection. In some cases, a rectal exam may be performed, or a short tube (anoscope) may be used to examine the anal canal.      How is this treated?    Initial treatment for this condition may include:   Taking steps to avoid constipation. This may include making changes to your diet, such as increasing your intake of fiber or fluid.     Taking fiber supplements. These supplements can soften your stool to help make bowel movements easier. Your health care provider may also prescribe a stool softener if your stool is hard.     Taking sitz baths. This may help to heal the tear.     Using medicated creams or ointments. These may be prescribed to lessen discomfort.      Treatments that are sometimes used if initial treatments do not work well or if the condition is more severe may include:   Botulinum injection.     Surgery to repair the fissure.        Follow these instructions at home:    Eating and drinking       Avoid foods that may cause constipation, such as bananas, milk, and other dairy products.     Eat all fruits, except bananas.     Drink enough fluid to keep your urine pale yellow.     Eat foods that are high in fiber, such as beans, whole grains, and fresh fruits and vegetables.      General instructions       Take  over-the-counter and prescription medicines only as told by your health care provider.     Use creams or ointments only as told by your health care provider.     Keep the anal area clean and dry.     Take sitz baths as told by your health care provider. Do not use soap  in the sitz baths.     Keep all follow-up visits as told by your health care provider. This is important.        Contact a health care provider if you have:     More bleeding.     A fever.     Diarrhea that is mixed with blood.     Pain that continues.     Ongoing problems that are getting worse rather than better.      Summary     An anal fissure is a small tear or crack in the tissue of the anus. This condition is usually caused by passing a large or hard stool (feces). Other causes include constipation and frequent diarrhea.     Initial treatment for this condition may include taking steps to avoid constipation, such as increasing your intake of fiber or fluid.     Follow instructions for care as told by your health care provider.     Contact your health care provider if you have more bleeding or your problem is getting worse rather than better.     Keep all follow-up visits as told by your health care provider. This is important.      This information is not intended to replace advice given to you by your health care provider. Make sure you discuss any questions you have with your health care provider.      Document Revised: 07/04/2017 Document Reviewed: 07/04/2017  Elsevier Patient Education ? 2021 Elsevier Inc.      ---------------------------------------------------------------------------------------------------------------------  [facility] allows patients to review your COVID and other test results as well as discharge documents from any [facility], Emergency Department, surgical center or outpatient lab. Test results are typically available 36 hours after the test is completed.     [facility] encourages you to self-enroll in the [facility] Patient Portal.     To begin your self-enrollment process, please visit [email or web address] Under [facility], click on "Sign up now".     NOTE: You must be 16 years and older to use [facility]  Self-Enroll online. If you are a parent, caregiver, or guardian; you need an invite to access your child's or dependent's health records. To obtain an invite, contact the Medical Records department at [phone] Monday through Friday, 8-4:30, select option 3 . If we receive your call afterhours, we will return your call the next business day.     If you have issues trying to create or access your account, contact [employer] support at [phone] available 7 days a week 24 hours a day.     Comment:

## 2021-12-22 ENCOUNTER — Encounter: Admit: 2021-12-22 | Discharge: 2021-12-22 | Attending: Obstetrics & Gynecology

## 2021-12-22 ENCOUNTER — Encounter

## 2021-12-22 DIAGNOSIS — Z01419 Encounter for gynecological examination (general) (routine) without abnormal findings: Secondary | ICD-10-CM

## 2021-12-22 LAB — CBC WITH AUTO DIFFERENTIAL
Absolute Baso #: 0 10*3/uL (ref 0.0–0.2)
Absolute Eos #: 0.1 10*3/uL (ref 0.0–0.5)
Absolute Lymph #: 1.9 10*3/uL (ref 1.0–3.2)
Absolute Mono #: 0.6 10*3/uL (ref 0.3–1.0)
Basophils %: 0.4 % (ref 0.0–2.0)
Eosinophils %: 1.1 % (ref 0.0–7.0)
Hematocrit: 40.4 % (ref 34.0–47.0)
Hemoglobin: 13.6 g/dL (ref 11.5–15.7)
Immature Grans (Abs): 0.04 10*3/uL (ref 0.00–0.06)
Immature Granulocytes: 0.4 % (ref 0.0–0.6)
Lymphocytes: 18.3 % (ref 15.0–45.0)
MCH: 27.1 pg (ref 27.0–34.5)
MCHC: 33.7 g/dL (ref 32.0–36.0)
MCV: 80.6 fL — ABNORMAL LOW (ref 81.0–99.0)
MPV: 9.5 fL (ref 7.2–13.2)
Monocytes: 5.4 % (ref 4.0–12.0)
NRBC Absolute: 0 10*3/uL (ref 0.000–0.012)
NRBC Automated: 0 % (ref 0.0–0.2)
Neutrophils %: 74.4 % — ABNORMAL HIGH (ref 42.0–74.0)
Neutrophils Absolute: 7.7 10*3/uL — ABNORMAL HIGH (ref 1.6–7.3)
Platelets: 446 10*3/uL — ABNORMAL HIGH (ref 140–440)
RBC: 5.01 x10e6/mcL (ref 3.60–5.20)
RDW: 14.1 % (ref 11.0–16.0)
WBC: 10.3 10*3/uL (ref 3.8–10.6)

## 2021-12-22 LAB — AMB POC URINALYSIS DIP STICK MANUAL W/O MICRO
Bilirubin, Urine, POC: NEGATIVE
Glucose, Urine, POC: NEGATIVE
Nitrite, Urine, POC: NEGATIVE
Specific Gravity, Urine, POC: 1.03 (ref 1.001–1.035)
Urobilinogen, POC: NORMAL
pH, Urine, POC: 5 (ref 4.6–8.0)

## 2021-12-22 LAB — PROGESTERONE: Progesterone: <0.64 ng/mL

## 2021-12-22 LAB — HM PAP SMEAR

## 2021-12-22 LAB — TSH WITH REFLEX: TSH: 2.36 mcIU/mL (ref 0.358–3.740)

## 2021-12-22 MED ORDER — CIPROFLOXACIN HCL 500 MG PO TABS
500 MG | ORAL_TABLET | Freq: Two times a day (BID) | ORAL | 0 refills | Status: AC
Start: 2021-12-22 — End: 2021-12-27

## 2021-12-22 NOTE — Progress Notes (Signed)
CC: Patient presents today for New Patient (NP here for yearly exam  c/o abnormal periods and pelvic pain  urine dip in office today )    HPI: see assessment and plan    OB History       Gravida   0    Para   0    Term   0    Preterm   0    AB   0    Living   0         SAB   0    IAB   0    Ectopic   0    Molar   0    Multiple   0    Live Births   0                GYN History     GYN History      Last Pap result:  No results found for: "DIAG", "SPECADEQ", "METHOD", "FAO130865", "HPV16"      Past Medical History:  Past Medical History:   Diagnosis Date    Abnormal menstrual periods     Depression     Hypertension     Type 2 diabetes mellitus without complication (HCC)        Past Surgical History:  Past Surgical History:   Procedure Laterality Date    WISDOM TOOTH EXTRACTION         Allergies:   Allergies   Allergen Reactions    Cephalexin Itching, Other (See Comments), Rash and Swelling     Other reaction(s): Other (See Comments), Rash, Unknown  Event:    Irritation and aggravation.  Irritation and aggravation.  Irritation and aggravation.  Irritation and aggravation.      Sulfamethoxazole-Trimethoprim Other (See Comments)     Chest and abdominal pain       Medication History:  Current Outpatient Medications   Medication Sig Dispense Refill    traZODone (DESYREL) 50 MG tablet TK 1 T PO  QHS      ciprofloxacin (CIPRO) 500 MG tablet Take 1 tablet by mouth 2 times daily for 5 days 10 tablet 0     No current facility-administered medications for this visit.       Social History:  Social History     Socioeconomic History    Marital status: Married     Spouse name: Not on file    Number of children: Not on file    Years of education: Not on file    Highest education level: Not on file   Occupational History    Not on file   Tobacco Use    Smoking status: Never    Smokeless tobacco: Never   Vaping Use    Vaping Use: Former   Substance and Sexual Activity    Alcohol use: Yes    Drug use: Yes     Types: Marijuana Sheran Fava)     Sexual activity: Yes     Partners: Male   Other Topics Concern    Not on file   Social History Narrative    Not on file     Social Determinants of Health     Financial Resource Strain: Not on file   Food Insecurity: Not on file   Transportation Needs: Not on file   Physical Activity: Not on file   Stress: Not on file   Social Connections: Not on file   Intimate Partner Violence: Not on file  Housing Stability: Not on file       Family History:  Family History   Problem Relation Age of Onset    Hypertension Father     Heart Surgery Father     Asthma Mother     Breast Cancer Maternal Aunt        Objective:   BP 118/68   Ht 1.727 m (5\' 8" )   Wt (!) 140.1 kg (308 lb 12.8 oz)   LMP 12/17/2021   BMI 46.95 kg/m   Urine:@LASTPROCAMB (POC81001;POC81003;poc81002;poc81000;POC81001F;POC81003E)@  Hemoglobin: No components found for: "SPHB"   The patient appears well, alert, oriented x 3, in no distress.    PHYSICAL EXAM:    General Examination:  GENERAL APPEARANCE: alert, well hydrated, in no distress.   HEAD: normocephalic, atraumatic.   EYES: no lid lag, stare or proptosis, extraocular movement full and smooth.   NECK: thyroid normal.   RESPIRATORY: unlabored.   ABDOMEN: normal, soft, nontender, nondistended.   SKIN: normal, no suspicious lesions.   EXTREMITIES: no edema, normal, full range of motion.   NEUROLOGIC: alert and oriented, gait normal.       BREAST EXAM:    General Examination:  GENERAL APPEARANCE: pleasant, well nourished, well developed, in no acute distress.   NECK: no thyromegaly, neck supple.   LUNGS: good air movement.   BREASTS: no dimpling, no discharge, no masses palpable bilaterally, nontender.   ABDOMEN: soft, nontender, nondistended.            PELVIC EXAM:   Examination:   Gynecological:  EXTERNAL GENITALIA: normal, introitus normal.   URETHRA: meatus normal.   BLADDER: normal.   VAGINA: healthy pink mucosa without any lesions.   CERVIX: no cervical movement tenderness, no lesions.   UTERUS:  normal mobility, nontender.   ADNEXA: no mass, nontender.   ANUS/PERINEUM: normal.                Assessment/Plan:       ICD-10-CM    1. Women's annual routine gynecological examination  Z01.419       2. Pelvic pain  R10.2 AMB POC URINALYSIS DIP STICK MANUAL W/O MICRO     Culture, Urine      3. Irregular menses  N92.6 [place] NON OB TRANSVAGINAL     TSH with Reflex     Testosterone, Free/Tot Equilib     Progesterone     CBC with Auto Differential      4. Cervical cancer screening  Z12.4 PAP IG, Liquid-based rfx Aptima HPV when HPV ASCU 16/18,45 (063016)             12/22/2021 new patient annual , self-pay  26 year old G0  - Cervical screening: Normal history, cytology performed  - Contraception: Nothing currently but rarely sexually active, may want kids later on but not now, recently took Plan B  - Mother with a history of endometriosis  - Diabetes, severe panic disorder  - Problem: She complains of discomfort with sex with burning afterwards but no itching and then it goes away.  They are rarely sexually active because she states they are both heavier and her partner is having some problems.  No concerns about infidelity.  She also has some discomfort when she urinates and I will send a urine culture.  She took amoxicillin recently  - Problem: Menstrual concerns: She typically has normal cycles but did not have anything for the last 3 months until recently she had a short flow last week.  I will check lab  work and ultrasound and I am thinking this could possibly be due to her increased BMI of 47 I reviewed the importance of weight loss  - Social: Lives in [place], unemployed, monogamous 7 years cohabitating  - Follow-up: 2 weeks ultrasound, 1 year annual

## 2021-12-23 LAB — CULTURE, URINE: FINAL REPORT: NO GROWTH

## 2021-12-26 LAB — TESTOSTERONE, FREE/TOT EQUILIB
Testosterone % Free: 1.99 % (ref 0.50–2.80)
Testosterone, Free: 1.85 ng/dL — ABNORMAL HIGH (ref 0.10–0.85)
Testosterone: 93 ng/dL — ABNORMAL HIGH (ref 13–71)

## 2021-12-26 LAB — PAP IG, LIQUID-BASED RFX APTIMA HPV WHEN HPV ASCU 16/18,45 (199340): .: 0

## 2021-12-27 NOTE — Telephone Encounter (Signed)
Pt stated that she would like a call or a message back on the mychart because she has severe anxiety and just wants to make sure everything is okay with her test results. Please assist.

## 2021-12-27 NOTE — Telephone Encounter (Signed)
Spoke with pt, told her that she will review her [place] and labs at her appt. On 01/12/22 RM

## 2021-12-27 NOTE — Telephone Encounter (Signed)
-----  Message from Vonzella Nipple, MD sent at 12/27/2021 12:14 PM EST -----  Regarding: RE: Results update  Contact: [phone]  Can you please call this patient and let her know that we are going to review all of her lab work when we review her ultrasound report on the same day she gets her ultrasound done in early December.  There is nothing concerning at all and we will review it further at her follow-up  ----- Message -----  From: Tammi Sou, Remus Blake, MA  Sent: 12/26/2021   2:10 PM EST  To: Vonzella Nipple, MD  Subject: Results update                                   ----- Message from Tomma Lightning, MA sent at 12/26/2021  2:10 PM EST -----  Pt looking for next step for lab work, do you want to do a VV with her for this, please advise, thank you     ----- Message from Julie Reynolds to Schwartzberg, Esau Grew, MD sent at 12/26/2021 10:21 AM -----   Good morning.   I received my results Sunday morning and just wanted to know if there was anything else we were waiting on. I noticed you haven't called me yet so I just wanted to know what your next steps were and if my blood work and stuff was normal enough to you and there was. O thing to worry about?Marland Kitchen Also if you heard anything about the pcos results. I didn't see that in the stuff that was uploaded. If nothing came back besides the white blood cell count be elevated what do you think I should do besides continue taking the antibiotic you have me on. Also if I should still come for that ultra sound or not.    Also. The day after I met with you my period has been consistent like a normal period with a normal/heavy flow!    Thanks !  Please respond when you have time (:  Have a good day

## 2022-01-12 ENCOUNTER — Ambulatory Visit: Admit: 2022-01-12 | Discharge: 2022-01-12

## 2022-01-12 ENCOUNTER — Ambulatory Visit: Admit: 2022-01-12 | Discharge: 2022-01-12 | Attending: Obstetrics & Gynecology

## 2022-01-12 DIAGNOSIS — N926 Irregular menstruation, unspecified: Secondary | ICD-10-CM

## 2022-01-12 DIAGNOSIS — E282 Polycystic ovarian syndrome: Secondary | ICD-10-CM

## 2022-01-12 MED ORDER — NORELGESTROMIN-ETH ESTRADIOL 150-35 MCG/24HR TD PTWK
150-35 MCG/24HR | MEDICATED_PATCH | TRANSDERMAL | 3 refills | Status: DC
Start: 2022-01-12 — End: 2022-03-10

## 2022-01-12 NOTE — Progress Notes (Signed)
CC: Patient presents today for No chief complaint on file.    HPI: see assessment and plan    OB History       Gravida   0    Para   0    Term   0    Preterm   0    AB   0    Living   0         SAB   0    IAB   0    Ectopic   0    Molar   0    Multiple   0    Live Births   0                GYN History     Last Pap result:  Diagnosis:   Date Value Ref Range Status   12/22/2021 Comment  Final     Comment:     NEGATIVE FOR INTRAEPITHELIAL LESION OR MALIGNANCY.     Specimen adequacy:   Date Value Ref Range Status   12/22/2021 Comment  Final     Comment:     Satisfactory for evaluation. Endocervical and/or squamous metaplastic  cells (endocervical component) are present.       Methodology:   Date Value Ref Range Status   12/22/2021 Comment  Final     Comment:     This liquid based ThinPrep(R) pap test was screened with the  use of an image guided system.         Past Medical History:  Past Medical History:   Diagnosis Date    Abnormal menstrual periods     Depression     Hypertension     Type 2 diabetes mellitus without complication ([place])        Past Surgical History:  Past Surgical History:   Procedure Laterality Date    WISDOM TOOTH EXTRACTION         Allergies:   Allergies   Allergen Reactions    Cephalexin Itching, Other (See Comments), Rash and Swelling     Other reaction(s): Other (See Comments), Rash, Unknown  Event:    Irritation and aggravation.  Irritation and aggravation.  Irritation and aggravation.  Irritation and aggravation.      Sulfamethoxazole-Trimethoprim Other (See Comments)     Chest and abdominal pain       Medication History:  Current Outpatient Medications   Medication Sig Dispense Refill    norelgestromin-ethinyl estradiol Marilu Favre) 150-35 MCG/24HR Place 1 patch onto the skin once a week Apply one patch to clean skin once a week for 3 weeks, followed by one week patch free 3 patch 3    ALBUTEROL IN   2 puffs, Inhale, q4hr, PRN, may administer 1 or 2 puffs every 4 -6 hours as needed, # 1 EA, 0  Refill(s), shortness of breath/wheezing (Patient not taking: Reported on 01/12/2022)      traZODone (DESYREL) 50 MG tablet TK 1 T PO  QHS (Patient not taking: Reported on 01/12/2022)       No current facility-administered medications for this visit.       Social History:  Social History     Socioeconomic History    Marital status: Married     Spouse name: Not on file    Number of children: Not on file    Years of education: Not on file    Highest education level: Not on file   Occupational History    Not  on file   Tobacco Use    Smoking status: Never    Smokeless tobacco: Never   Vaping Use    Vaping Use: Former   Substance and Sexual Activity    Alcohol use: Yes    Drug use: Yes     Types: Marijuana Sheran Fava)    Sexual activity: Yes     Partners: Male   Other Topics Concern    Not on file   Social History Narrative    Not on file     Social Determinants of Health     Financial Resource Strain: Not on file   Food Insecurity: Not on file   Transportation Needs: Not on file   Physical Activity: Not on file   Stress: Not on file   Social Connections: Not on file   Intimate Partner Violence: Not on file   Housing Stability: Not on file       Family History:  Family History   Problem Relation Age of Onset    Hypertension Father     Heart Surgery Father     Asthma Mother     Breast Cancer Maternal Aunt        Objective:   BP 122/70   Ht 1.727 m (5\' 8" )   Wt (!) 140.3 kg (309 lb 6.4 oz)   LMP 12/17/2021   BMI 47.04 kg/m   Urine:@LASTPROCAMB (POC81001;POC81003;poc81002;poc81000;POC81001F;POC81003E)@  Hemoglobin: No components found for: "SPHB"   General Examination:  GENERAL APPEARANCE: alert, well hydrated, in no distress .   HEAD: normocephalic, atraumatic.   EYES: no lid lag, stare or proptosis.   SKIN: normal.   NEUROLOGIC: alert and oriented, gait normal.   PSYCH: alert, oriented, cognitive function intact, cooperative with exam, good eye contact, mood/affect appropriate      BREAST EXAM: not examined    PELVIC EXAM:  not examined    Assessment/Plan:       ICD-10-CM    1. PCOS (polycystic ovarian syndrome)  E28.2       2. Irregular menses  N92.6       3. Encounter for BCP (birth control pills) initial prescription  Z30.011                01/12/2022 follow-up-McKenzie is a 26 year old G0  - Follow-up oligomenorrhea/PCOS: I reviewed her ultrasound imaging showing a 14 mm uterine stripe with polycystic ovaries.  Testosterone levels are elevated and her hemoglobin is 13 and explained that her symptoms are consistent with PCOS.  I explained that she have a significant benefit if she had some weight loss as her current BMI is 47.  In the meantime we will begin a birth control patch for both contraception and cycle control and hopefully to decrease her testosterone levels.  She had a menstrual cycle on November 19 that lasted 7 days  - Elevated testosterone levels: See above  - Contraception: Initiate birth control patch, she cannot remember to take birth control pills but does not want anything implanted in her body  - Diabetes, anxiety with severe panic disorder  - Mother with a history of endometriosis  - Cervical screening: No abnormal history, normal cytology 12/2021  - Social: On and off with a long-term boyfriend, she currently unemployed, lives in [place]  - Follow-up November annual    12/22/2021 new patient annual , self-pay  26 year old G0  - Cervical screening: Normal history, cytology performed  - Contraception: Nothing currently but rarely sexually active, may want kids later on but not now, recently  took Plan B  - Mother with a history of endometriosis  - Diabetes, severe panic disorder  - Problem: She complains of discomfort with sex with burning afterwards but no itching and then it goes away.  They are rarely sexually active because she states they are both heavier and her partner is having some problems.  No concerns about infidelity.  She also has some discomfort when she urinates and I will send a urine culture.  She  took amoxicillin recently  - Problem: Menstrual concerns: She typically has normal cycles but did not have anything for the last 3 months until recently she had a short flow last week.  I will check lab work and ultrasound and I am thinking this could possibly be due to her increased BMI of 47 I reviewed the importance of weight loss  - Social: Lives in [place], unemployed, monogamous 7 years cohabitating  - Follow-up: 2 weeks ultrasound, 1 year annual

## 2022-01-16 NOTE — Telephone Encounter (Signed)
Patient is requesting to speak to Gregary Signs, she is returning her call.    Please assist.

## 2022-01-19 NOTE — Progress Notes (Signed)
Labs faxed to doctor office and rec'd confirmation

## 2022-03-08 NOTE — Telephone Encounter (Signed)
03/08/22, 2:32pm   Spoke to patient questioned her if she had taken a pregnancy test, pt reported she is not active but wanted to make sure her lack of cycles was due to her PCOS.  Pt stated she wanted to be reassured by provider only. A virtual visit was offered to patient, she denied this and wanted a follow up message sent to her.

## 2022-03-08 NOTE — Telephone Encounter (Signed)
norelgestromin-ethinyl estradiol Julie Reynolds) 150-35 MCG/24HR    Patient stated that she tried to put on a patch, but it was dry and the adhesive was not working, so it just fell off and she threw it out. She is requesting to have an extra patch sent to her pharmacy, if possible.     Pharmacy verified

## 2022-03-10 MED ORDER — NORELGESTROMIN-ETH ESTRADIOL 150-35 MCG/24HR TD PTWK
150-3524 MCG/24HR | MEDICATED_PATCH | TRANSDERMAL | 0 refills | Status: DC
Start: 2022-03-10 — End: 2023-03-20

## 2022-03-10 NOTE — Telephone Encounter (Signed)
Please be aware that medications have been refilled and sent to your pharmacy on file.  These medications have either been requested by you or by your pharmacy representative.  Please also confirm that you have your annual scheduled.  Kind regards, Dr. S

## 2022-03-23 NOTE — Telephone Encounter (Signed)
WORKING MEDIA REFERALS- LEFT KNEE AND HIP    LVM for patient to call back for scheduling

## 2022-03-30 NOTE — Telephone Encounter (Signed)
2nd call no answer, LVM

## 2022-04-09 NOTE — Telephone Encounter (Signed)
3rd attempt no answer, LVM for patient to callback for scheduling.

## 2022-05-02 NOTE — Telephone Encounter (Signed)
Refill- BC pills. Pt stated that the patches are not sticking anymore   Pharmacy verified- [facility] [address] [place]   Pt aware of 24/48 hr turn around time  Please assist.

## 2022-05-03 NOTE — Telephone Encounter (Signed)
Patient returned a missed call from the office likely related to her request to change from the pills from the patches for birth control. Please advise and assist.

## 2022-05-03 NOTE — Telephone Encounter (Signed)
05/03/22, 1:12pm  breifly spoke to pt, she was at dentist apt need to schedule for VV to discuss changing birth control  -----------IF PT calls back offer her VV on 05/15/22 at [facility]. For problem visit, discuss changing birth control.

## 2022-05-09 NOTE — Telephone Encounter (Signed)
Called pt to sched VV per Amil Amen. > No ans, LVM.

## 2022-05-11 NOTE — Telephone Encounter (Signed)
Patient is requesting to reschedule her VV with Dr. Rivka Safer.    Also, will there be a charge?    Please assist.

## 2022-05-11 NOTE — Telephone Encounter (Signed)
Called pt again to sched VV. > No ans, LVM and left message in MyChart.

## 2022-05-14 NOTE — Telephone Encounter (Signed)
Called pt to sched VV > No ans, LVM.

## 2022-05-15 NOTE — Telephone Encounter (Signed)
05/15/22, 1:21pm  LVM, YES she does need a VV (or visit) to change birth control. requested her to call office so we can schedule this for her

## 2022-05-15 NOTE — Telephone Encounter (Signed)
Spoke to pt to sched VV. She does not want to do an actual appt, even virtual. She is just wanting to switch from patch to pill due to patch not sticking on her skin. I am unsure of whether this is possible without a visit, so could you please assist. Thanks!

## 2022-05-16 NOTE — Telephone Encounter (Signed)
Pt returning call to schedule VV, please assist.

## 2022-05-16 NOTE — Telephone Encounter (Signed)
TRIED CONTACTING PT TO SCHEDULE VV/ NO ANSWER/ LVM

## 2022-05-24 ENCOUNTER — Telehealth: Admit: 2022-05-24 | Discharge: 2022-05-24 | Attending: Obstetrics & Gynecology

## 2022-05-24 DIAGNOSIS — E282 Polycystic ovarian syndrome: Secondary | ICD-10-CM

## 2022-05-24 MED ORDER — MEDROXYPROGESTERONE ACETATE 10 MG PO TABS
10 MG | ORAL_TABLET | Freq: Every day | ORAL | 0 refills | Status: AC
Start: 2022-05-24 — End: 2022-05-31

## 2022-05-24 MED ORDER — NORGESTIMATE-ETH ESTRADIOL 0.25-35 MG-MCG PO TABS
0.25-35- MG-MCG | PACK | Freq: Every day | ORAL | 3 refills | Status: AC
Start: 2022-05-24 — End: 2023-03-20

## 2022-05-24 NOTE — Progress Notes (Unsigned)
01/12/2022 follow-up-McKenzie is a 27 year old G0  - Follow-up oligomenorrhea/PCOS: I reviewed her ultrasound imaging showing a 14 mm uterine stripe with polycystic ovaries.  Testosterone levels are elevated and her hemoglobin is 13 and explained that her symptoms are consistent with PCOS.  I explained that she have a significant benefit if she had some weight loss as her current BMI is 47.  In the meantime we will begin a birth control patch for both contraception and cycle control and hopefully to decrease her testosterone levels.  She had a menstrual cycle on November 19 that lasted 7 days  - Elevated testosterone levels: See above  - Contraception: Initiate birth control patch, she cannot remember to take birth control pills but does not want anything implanted in her body  - Diabetes, anxiety with severe panic disorder  - Mother with a history of endometriosis  - Cervical screening: No abnormal history, normal cytology 12/2021  - Social: On and off with a long-term boyfriend, she currently unemployed, lives in [place]  - Follow-up November annual     12/22/2021 new patient annual , self-pay  27 year old G0  - Cervical screening: Normal history, cytology performed  - Contraception: Nothing currently but rarely sexually active, may want kids later on but not now, recently took Plan B  - Mother with a history of endometriosis  - Diabetes, severe panic disorder  - Problem: She complains of discomfort with sex with burning afterwards but no itching and then it goes away.  They are rarely sexually active because she states they are both heavier and her partner is having some problems.  No concerns about infidelity.  She also has some discomfort when she urinates and I will send a urine culture.  She took amoxicillin recently  - Problem: Menstrual concerns: She typically has normal cycles but did not have anything for the last 3 months until recently she had a short flow last week.  I will check lab work and ultrasound  and I am thinking this could possibly be due to her increased BMI of 47 I reviewed the importance of weight loss  - Social: Lives in [place], unemployed, monogamous 7 years cohabitating  - Follow-up: 2 weeks ultrasound, 1 year annual

## 2022-12-18 ENCOUNTER — Encounter: Attending: Obstetrics & Gynecology

## 2023-03-20 ENCOUNTER — Ambulatory Visit: Admit: 2023-03-20 | Discharge: 2023-03-20 | Attending: Family

## 2023-03-20 VITALS — BP 148/89 | HR 112 | Temp 98.00000°F | Resp 21 | Ht 68.0 in | Wt 304.5 lb

## 2023-03-20 DIAGNOSIS — Z711 Person with feared health complaint in whom no diagnosis is made: Secondary | ICD-10-CM

## 2023-03-20 NOTE — Patient Instructions (Addendum)
Exposure seems slim--we discussed transmission through birth, breastfeeding, intercourse and IV drug use. It is not transmitted via saliva.     Husband is being tested today as well.     We discussed likely one test will come back positive--and we want her to have immunity from previous vaccinations. I asked her to not look at her results before we call her as she has significant health anxiety.

## 2023-03-20 NOTE — Progress Notes (Signed)
ASSESSMENT/PLAN:    1. Concern about sexually transmitted infection in female without diagnosis  -     Hepatitis B Surface Antigen  -     Hepatitis B Core Ab W/Reflex ([facility] DEFAULT)  -     Hepatitis B Surface Antibody    Orders Placed This Encounter   Procedures    Hepatitis B Surface Antigen    Hepatitis B Core Ab W/Reflex ([facility] DEFAULT)    Hepatitis B Surface Antibody     No results found for any visits on 03/20/23.    Patient Instructions   Exposure seems slim--we discussed transmission through birth, breastfeeding, intercourse and IV drug use. It is not transmitted via saliva.     Husband is being tested today as well.     We discussed likely one test will come back positive--and we want her to have immunity from previous vaccinations. I asked her to not look at her results before we call her as she has significant health anxiety.        Return if symptoms worsen or fail to improve.      SUBJECTIVE/OBJECTIVE:  Julie Reynolds (DOB:  08-21-1995) is a 28 y.o. female, here for evaluation of the following chief complaint(s):  hepatitis testing      HPI: Concettina Leth Reynolds is a 28 y.o. female requesting Hepatitis B testing. She never had any vaccine waivers for school and believes she was fully vaccinated, but her mother is dead and she cannot ask her to be certain.     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 She mentioned to them she had Hepatitis a few years ago and they stated that explained her lab work because there was "evidence of hepatitis".     She advised her husband that she did not have it at his or his sisters birth. Jeralyn Bennett never breastfed. He denies any other possible exposures (intercourse of IV drug use).     Review of Systems   Constitutional:  Negative for activity change, chills, fatigue and fever.   HENT:  Negative for congestion, ear pain, postnasal drip, rhinorrhea, sinus pressure and sore throat.    Respiratory:  Negative for  cough, shortness of breath and wheezing.    Cardiovascular:  Negative for chest pain and palpitations.   Gastrointestinal:  Negative for constipation, diarrhea, nausea and vomiting.   Genitourinary:  Negative for difficulty urinating, frequency, hematuria and urgency.   Musculoskeletal:  Negative for back pain.   Skin:  Negative for color change.   Neurological:  Negative for dizziness, syncope and headaches.   Psychiatric/Behavioral:  Negative for confusion.        Allergies   Allergen Reactions    Cephalexin Itching, Other (See Comments), Rash and Swelling     Other reaction(s): Other (See Comments), Rash, Unknown  Event:    Irritation and aggravation.  Irritation and aggravation.  Irritation and aggravation.  Irritation and aggravation.      Sulfamethoxazole-Trimethoprim Other (See Comments)     Chest and abdominal pain      Past Medical History:   Diagnosis Date    Abnormal menstrual periods     Depression     Hypertension     Type 2 diabetes mellitus without complication [facility])      Outpatient Medications Marked as Taking for the 03/20/23 encounter (Office Visit) with Trellis Moment, APRN - NP   Medication Sig Dispense Refill    hydrOXYzine  HCl (ATARAX) 25 MG tablet Take 1 tablet by mouth       Past Surgical History:   Procedure Laterality Date    WISDOM TOOTH EXTRACTION       Family History   Problem Relation Age of Onset    Hypertension Father     Heart Surgery Father     Asthma Mother     Breast Cancer Maternal Aunt      Social History     Tobacco Use   Smoking Status Never   Smokeless Tobacco Never     Social History     Socioeconomic History    Marital status: Married     Spouse name: None    Number of children: None    Years of education: None    Highest education level: None   Tobacco Use    Smoking status: Never    Smokeless tobacco: Never   Vaping Use    Vaping status: Former   Substance and Sexual Activity    Alcohol use: Yes    Drug use: Yes     Types: Marijuana (Weed)    Sexual activity: Yes      Partners: Male       BP (!) 148/89   Pulse (!) 112   Temp 98 F (36.7 C)   Resp 21   Ht 1.727 m (5\' 8" )   Wt (!) 138.1 kg (304 lb 8 oz)   SpO2 97%   BMI 46.30 kg/m    Physical Exam  Vitals and nursing note reviewed.   Constitutional:       Appearance: She is obese.      Comments: Crying intermittently through history and discussion d/t anxiety   HENT:      Head: Normocephalic and atraumatic.      Left Ear: External ear normal.      Nose: Nose normal.   Eyes:      Conjunctiva/sclera: Conjunctivae normal.   Cardiovascular:      Rate and Rhythm: Tachycardia present.   Pulmonary:      Effort: Pulmonary effort is normal.   Musculoskeletal:         General: Normal range of motion.   Skin:     General: Skin is warm.      Capillary Refill: Capillary refill takes less than 2 seconds.   Neurological:      General: No focal deficit present.      Mental Status: She is alert.   Psychiatric:         Thought Content: Thought content normal.                 (Note to patient: The 21st Century Cures Act requires that medical notes like this be available to patients in the interest of transparency. However, be advised this is a medical document. It is intended as peer to peer communication. It is written in medical language and may contain abbreviations or verbiage that are unfamiliar. It may appear blunt or direct. Medical documents are intended to carry relevant information, facts as evident, and the clinical opinion of the practitioner.)    An electronic signature was used to authenticate this note.    --Trellis Moment, APRN - NP

## 2023-03-20 NOTE — Addendum Note (Signed)
Addended by: Wendie Simmer on: 03/20/2023 01:59 PM     Modules accepted: Orders

## 2023-03-22 LAB — MISCELLANEOUS SENDOUT: test code: 160101

## 2023-03-26 LAB — HEPATITIS B SURFACE ANTIBODY
Hep B S Ab Interp: NON-IMMUNE/NOT IMMUNE
Hep B S Ab: <3.5 — ABNORMAL LOW (ref 11.5–999.0)

## 2023-03-26 LAB — HEPATITIS B SURFACE ANTIGEN: Hepatitis B Surface Ag: NEGATIVE

## 2023-03-26 NOTE — Other (Signed)
Lm for pt to call the office back about labs

## 2023-03-26 NOTE — Telephone Encounter (Signed)
Called the lab and spoke with Irving Burton about the labs and she stated that the test was not san but they will run the test because they still have blood.

## 2023-03-26 NOTE — Telephone Encounter (Signed)
-----   Message from Kateri Plummer, APRN - NP sent at 03/24/2023  7:19 PM EST -----  Can you please call the lab and see when the other tests should be resulted? Thank you!

## 2023-03-27 NOTE — Telephone Encounter (Signed)
Unfortunately she may not remember, but I discussed likely I would not be the person who called her with the results as I do not work everyday. When results come in we try to call once everything is resulted.     I have had calls placed to the lab, who is running her 2 remaining tests. They have not been resulted yet. We were waiting on all of the results for her to be given her results.     I did call her today to try and notify her that the only test that has been resulted is the Hepatitis B core antibody test, which was negative. This means that she does not currently have an active infection of Hepatitis B. However, the 2 tests that are pending are to determine if she has immunity to the virus or if she has ever been infected with the virus in the past.     If she returns the call, please let her know the above. Thank you.

## 2023-03-27 NOTE — Telephone Encounter (Signed)
Patient called back concerned that she may have a new exposure to Hep B. She states that her mother in law was cooking dinner today and cut her knuckle while she was prepping the food. The food was then cooked in a slow cooker to food safe temps (food was ham). Julie Reynolds was questioning if she has new concern for a possible infection at this time.     We discussed that it is difficult to say without any doubt as I do not have mother in laws labs available to me. We cannot be certain what "evidence of hepatitis" was on her labs to start this concern.     We did discuss however, that the routes of transmission include IV drug use (blood to blood transmission), maternal to fetus transmission, and sexual intercourse. Therefore the likelihood is there is no significant transmission from fully cooked food potentially contaminated with blood that could survive the gastric secretions to cause an active infection. I do not believe she should be concerned at this time.     I did reiterate my recommendation to be vaccinated at her earliest convenience to prevent further concern.

## 2023-03-27 NOTE — Telephone Encounter (Signed)
Patient called back and I reviewed ALL 3 results with her. She understands that she shows no immunity and will discuss obtaining the vaccination with her PCP. All questions answered.

## 2024-02-14 NOTE — Telephone Encounter (Signed)
 Pt called in regarding OV or VV w/ Dr. Jamelle, asap to discuss her blood sugar/anxieties/ and Metformin rx. Pt prefers to be seen at [facility], but is open to being seen at [facility] if she can be seen sooner. Pt states she has lab results form 6 months ago, and is able to provide them. Unable to see any openings, please advise.

## 2024-02-14 NOTE — Telephone Encounter (Signed)
 Pt requesting to be seen sooner for a vv if poss. Pt is wanting to get her blood sugar regulated, pt is concerned. Please assist.

## 2024-02-25 ENCOUNTER — Telehealth: Admit: 2024-02-25 | Discharge: 2024-02-25 | Attending: Obstetrics & Gynecology

## 2024-02-25 DIAGNOSIS — Z6841 Body Mass Index (BMI) 40.0 and over, adult: Principal | ICD-10-CM

## 2024-02-25 MED ORDER — METFORMIN HCL ER 500 MG PO TB24
500 | ORAL_TABLET | Freq: Every day | ORAL | 3 refills | 90.00000 days | Status: AC
Start: 2024-02-25 — End: ?

## 2024-02-25 NOTE — Progress Notes (Signed)
 Julie Reynolds was evaluated through a synchronous (real-time) audio-video encounter. The patient (or guardian if applicable) is aware that this is a billable service, which includes applicable co-pays. This Virtual Visit was conducted with patient's (and/or legal guardian's) consent. The visit was conducted pursuant to the emergency declaration under the [employer] and the [employer], [employer]5 waiver authority and the [employer] Act.  Patient identification was verified, and a caregiver was present when appropriate.     Julie Reynolds (DOB:  Sep 01, 1995) is an established patient, here for evaluation of the following: No chief complaint on file.       HPI:   See note    Last Pap result:  Diagnosis:   Date Value Ref Range Status   12/22/2021 Comment  Final     Comment:     NEGATIVE FOR INTRAEPITHELIAL LESION OR MALIGNANCY.     Specimen adequacy:   Date Value Ref Range Status   12/22/2021 Comment  Final     Comment:     Satisfactory for evaluation. Endocervical and/or squamous metaplastic  cells (endocervical component) are present.       Methodology:   Date Value Ref Range Status   12/22/2021 Comment  Final     Comment:     This liquid based ThinPrep(R) pap test was screened with the  use of an image guided system.         Past Medical History:  Past Medical History:   Diagnosis Date    Abnormal menstrual periods     Depression     Hypertension     Type 2 diabetes mellitus without complication        Past Surgical History:  Past Surgical History:   Procedure Laterality Date    WISDOM TOOTH EXTRACTION         Allergies:   Allergies   Allergen Reactions    Cephalexin Itching, Other (See Comments), Rash and Swelling     Other reaction(s): Other (See Comments), Rash, Unknown  Event:    Irritation and aggravation.  Irritation and aggravation.  Irritation and aggravation.  Irritation and aggravation.      Sulfamethoxazole-Trimethoprim Other  (See Comments)     Chest and abdominal pain       Medication History:  Current Outpatient Medications   Medication Sig Dispense Refill    norgestimate -ethinyl estradiol  (ORTHO-CYCLEN) 0.25-35 MG-MCG per tablet Take 1 tablet by mouth daily      metFORMIN  (GLUCOPHAGE -XR) 500 MG extended release tablet Take 1 tablet by mouth daily (with breakfast) 90 tablet 3    amLODIPine (NORVASC) 10 MG tablet Take 1 tablet by mouth every morning      hydrOXYzine HCl (ATARAX) 25 MG tablet Take 1 tablet by mouth       No current facility-administered medications for this visit.       Social History:  Social History     Socioeconomic History    Marital status: Married     Spouse name: Not on file    Number of children: Not on file    Years of education: Not on file    Highest education level: Not on file   Occupational History    Not on file   Tobacco Use    Smoking status: Never    Smokeless tobacco: Never   Vaping Use    Vaping status: Former   Substance and Sexual Activity    Alcohol use: Yes    Drug use:  Yes     Types: Marijuana Oda)    Sexual activity: Yes     Partners: Male   Other Topics Concern    Not on file   Social History Narrative    Not on file     Social Drivers of Health     Financial Resource Strain: Patient Declined (05/27/2023)    Received from [facility]    Overall Financial Resource Strain (CARDIA)     Difficulty of Paying Living Expenses: Patient declined   Food Insecurity: Patient Declined (05/27/2023)    Received from [facility]    Hunger Vital Sign     Within the past 12 months, you worried that your food would run out before you got the money to buy more.: Patient declined     Within the past 12 months, the food you bought just didn't last and you didn't have money to get more.: Patient declined   Transportation Needs: Patient Declined (05/27/2023)    Received from [facility]    PRAPARE - Transportation     Lack of Transportation (Medical): Patient declined      Lack of Transportation (Non-Medical): Patient declined   Physical Activity: Unknown (05/27/2023)    Received from [facility]    Exercise Vital Sign     On average, how many days per week do you engage in moderate to strenuous exercise (like a brisk walk)?: 1 day     Minutes of Exercise per Session: Not on file   Stress: Not on file   Social Connections: Unknown (05/27/2023)    Received from [facility]    Social Connection and Isolation Panel     In a typical week, how many times do you talk on the phone with family, friends, or neighbors?: Twice a week     How often do you get together with friends or relatives?: Patient declined     How often do you attend church or religious services?: Patient declined     Do you belong to any clubs or organizations such as church groups, unions, fraternal or athletic groups, or school groups?: Patient declined     How often do you attend meetings of the clubs or organizations you belong to?: Patient declined     Are you married, widowed, divorced, separated, never married, or living with a partner?: Patient declined   Intimate Partner Violence: Not on file   Housing Stability: Patient Declined (05/27/2023)    Received from [facility]    Housing Stability Vital Sign     In the last 12 months, was there a time when you were not able to pay the mortgage or rent on time?: Patient declined     Number of Times Moved in the Last Year: Not on file     At any time in the past 12 months, were you homeless or living in a shelter (including now)?: Patient declined       Family History:  Family History   Problem Relation Age of Onset    Hypertension Father     Heart Surgery Father     Asthma Mother     Breast Cancer Maternal Aunt        General Examination:  GENERAL APPEARANCE: alert, well hydrated, in no distress .   HEAD: normocephalic, atraumatic.   EYES: no lid lag, stare or proptosis.   NEUROLOGIC: alert and oriented.   PSYCH: alert, oriented,  cognitive function intact, good eye  contact, cooperative with exam.      Diagnosis Orders   1. Class 3 severe obesity due to excess calories without serious comorbidity with body mass index (BMI) of 45.0 to 49.9 in adult (HCC)  Hemoglobin A1C      2. Difficulty losing weight        3. Elevated testosterone level in female        4. Surveillance for birth control, oral contraceptives             ASSESSMENT/PLAN:        02/25/2024 Julie Reynolds virtual visit problem focus via MyChart and I am at the [place] office and the patient is at home.  29 year old G0  - Follow-up oligomenorrhea/PCOS/elevated testosterone: She did not like the birth control patch and is currently on Sprintec/Ortho-Cyclen.  She tried the IUD in [place]  but had a really bad experience.  She is fine enough on her current OCP and risk and benefits again reviewed  - Elevated testosterone levels: Hoping that the birth control pill will decrease this and will continue to follow and check her levels after she has been on it for continuous amount of time  - Weight loss/obesity: Most recent weight 304#.  She was doing well until her dad got sick and now she is up to 315 pounds and have encouraged her to resume diet and exercise.  She is taking metformin  in the past I will go ahead and initiate this and we will plan to check an A1c in a few months.  Recommend [employer]  - Contraception: Initiate Sprintec 05/2022  - PMH: CHTN on Norvasc, diabetes, anxiety with panic disorder  - Mother with a history of endometriosis  - Cervical screening: Normal history, normal cytology 12/2021  - Social: Long-term boyfriend, museum/gallery conservator at [place], lives in [place]  - Follow-up: 66-month weight check, overdue for annual  ----------------------------------------------- ---------------------------------------------------------------------------  05/24/2022 virtual visit via MyChart and the patient is at home and I am at the [place] office.  Julie Reynolds is a  29 year old G0  - Follow-up oligomenorrhea/PCOS/elevated testosterone: She had trouble with the birth control patch not sticking and again a rash to her skin so she wants to get back onto birth control and I have sent in a Provera  withdrawal to start now and then initiate Sprintec and have given her a year supply with instructions.  - Elevated testosterone levels: Hoping that the birth control pill will decrease this and will continue to follow and check her levels after she has been on it for continuous amount of time  - Contraception: Initiate Sprintec 05/2022  - PMH: Diabetes, anxiety with panic disorder  - Mother with a history of endometriosis  - Cervical screening: Normal history, normal cytology 12/2021  - Social: Long-term boyfriend, she is considering moving up to [place]  and asked her prescription to be sent up there.  She is not sure if she is going to stay there and has an annual for November and she will keep that for now.  Currently unemployed  - Follow-up: Annual due in November     --------------------------------------------------------------------------------------------------------------------------  01/12/2022 follow-up-Julie Reynolds is a 29 year old G0  - Follow-up oligomenorrhea/PCOS: I reviewed her ultrasound imaging showing a 14 mm uterine stripe with polycystic ovaries.  Testosterone levels are elevated and her hemoglobin is 13 and explained that her symptoms are consistent with PCOS.  I explained that she have a significant benefit if she had some weight loss as her current BMI is 47.  In the meantime we will begin a birth control patch for both contraception and cycle control and hopefully to decrease her testosterone levels.  She had a menstrual cycle on November 19 that lasted 7 days  - Elevated testosterone levels: See above  - Contraception: Initiate birth control patch, she cannot remember to take birth control pills but does not want anything implanted in her body  - Diabetes,  anxiety with severe panic disorder  - Mother with a history of endometriosis  - Cervical screening: No abnormal history, normal cytology 12/2021  - Social: On and off with a long-term boyfriend, she currently unemployed, lives in [place]  - Follow-up November annual     12/22/2021 new patient annual , self-pay  29 year old G0  - Cervical screening: Normal history, cytology performed  - Contraception: Nothing currently but rarely sexually active, may want kids later on but not now, recently took Plan B  - Mother with a history of endometriosis  - Diabetes, severe panic disorder  - Problem: She complains of discomfort with sex with burning afterwards but no itching and then it goes away.  They are rarely sexually active because she states they are both heavier and her partner is having some problems.  No concerns about infidelity.  She also has some discomfort when she urinates and I will send a urine culture.  She took amoxicillin recently  - Problem: Menstrual concerns: She typically has normal cycles but did not have anything for the last 3 months until recently she had a short flow last week.  I will check lab work and ultrasound and I am thinking this could possibly be due to her increased BMI of 47 I reviewed the importance of weight loss  - Social: Lives in [place], unemployed, monogamous 7 years cohabitating  - Follow-up: 2 weeks ultrasound, 1 year annual

## 2024-02-26 NOTE — Progress Notes (Signed)
 Canceled patients March visit and scheduled a 66-month check up in April. The patient also updated the pharmacy and requested medication to go there [facility] on [address].

## 2024-02-26 NOTE — Telephone Encounter (Signed)
 Called RX in to [facility] [address] [place] Metformin 500 mg 1 every m orning with breakfast # 90 3 RF. RM
